# Patient Record
Sex: Male | Born: 1960 | Race: White | Hispanic: No | Marital: Married | Smoking: Never smoker
Health system: Southern US, Community
[De-identification: ages and names within clinical notes are randomized; demographics above are authoritative.]

## PROBLEM LIST (undated history)

## (undated) DIAGNOSIS — K219 Gastro-esophageal reflux disease without esophagitis: Secondary | ICD-10-CM

## (undated) DIAGNOSIS — I499 Cardiac arrhythmia, unspecified: Secondary | ICD-10-CM

## (undated) DIAGNOSIS — S42209A Unspecified fracture of upper end of unspecified humerus, initial encounter for closed fracture: Secondary | ICD-10-CM

## (undated) DIAGNOSIS — J45909 Unspecified asthma, uncomplicated: Secondary | ICD-10-CM

## (undated) DIAGNOSIS — E785 Hyperlipidemia, unspecified: Secondary | ICD-10-CM

## (undated) DIAGNOSIS — I493 Ventricular premature depolarization: Secondary | ICD-10-CM

## (undated) HISTORY — DX: Unspecified asthma, uncomplicated: J45.909

## (undated) HISTORY — PX: SQUAMOUS CELL CARCINOMA EXCISION: SHX2433

---

## 2007-12-02 ENCOUNTER — Emergency Department (HOSPITAL_BASED_OUTPATIENT_CLINIC_OR_DEPARTMENT_OTHER): Admission: EM | Admit: 2007-12-02 | Discharge: 2007-12-02 | Payer: Self-pay | Admitting: Emergency Medicine

## 2010-11-05 LAB — CBC
HCT: 43.8
Hemoglobin: 15
MCHC: 34.3
MCV: 92.4
Platelets: 302
RBC: 4.75
WBC: 8.8

## 2010-11-05 LAB — BASIC METABOLIC PANEL
CO2: 25
Calcium: 9.9
Creatinine, Ser: 0.9
GFR calc Af Amer: >60
Glucose, Bld: 106 — ABNORMAL HIGH
Sodium: 142

## 2010-11-05 LAB — DIFFERENTIAL
Eosinophils Absolute: 0.1
Lymphs Abs: 1.4
Monocytes Absolute: 0.4

## 2010-11-05 LAB — POCT CARDIAC MARKERS
CKMB, poc: <1 — ABNORMAL LOW
Troponin i, poc: <0.05

## 2015-05-11 ENCOUNTER — Emergency Department (HOSPITAL_COMMUNITY): Payer: PRIVATE HEALTH INSURANCE

## 2015-05-11 ENCOUNTER — Encounter (HOSPITAL_COMMUNITY): Payer: Self-pay | Admitting: Emergency Medicine

## 2015-05-11 ENCOUNTER — Emergency Department (HOSPITAL_COMMUNITY)
Admission: EM | Admit: 2015-05-11 | Discharge: 2015-05-11 | Disposition: A | Discharge status: Home / Self Care | Payer: PRIVATE HEALTH INSURANCE | Attending: Emergency Medicine | Admitting: Emergency Medicine

## 2015-05-11 DIAGNOSIS — Z8679 Personal history of other diseases of the circulatory system: Secondary | ICD-10-CM | POA: Diagnosis not present

## 2015-05-11 DIAGNOSIS — S42292A Other displaced fracture of upper end of left humerus, initial encounter for closed fracture: Secondary | ICD-10-CM | POA: Insufficient documentation

## 2015-05-11 DIAGNOSIS — Y9289 Other specified places as the place of occurrence of the external cause: Secondary | ICD-10-CM | POA: Diagnosis not present

## 2015-05-11 DIAGNOSIS — W1840XA Slipping, tripping and stumbling without falling, unspecified, initial encounter: Secondary | ICD-10-CM | POA: Diagnosis not present

## 2015-05-11 DIAGNOSIS — S4992XA Unspecified injury of left shoulder and upper arm, initial encounter: Secondary | ICD-10-CM | POA: Diagnosis present

## 2015-05-11 DIAGNOSIS — Y998 Other external cause status: Secondary | ICD-10-CM | POA: Insufficient documentation

## 2015-05-11 DIAGNOSIS — R0602 Shortness of breath: Secondary | ICD-10-CM | POA: Insufficient documentation

## 2015-05-11 DIAGNOSIS — S42201A Unspecified fracture of upper end of right humerus, initial encounter for closed fracture: Secondary | ICD-10-CM

## 2015-05-11 DIAGNOSIS — Y9389 Activity, other specified: Secondary | ICD-10-CM | POA: Insufficient documentation

## 2015-05-11 DIAGNOSIS — Z8719 Personal history of other diseases of the digestive system: Secondary | ICD-10-CM | POA: Insufficient documentation

## 2015-05-11 DIAGNOSIS — Z8639 Personal history of other endocrine, nutritional and metabolic disease: Secondary | ICD-10-CM | POA: Diagnosis not present

## 2015-05-11 HISTORY — DX: Gastro-esophageal reflux disease without esophagitis: K21.9

## 2015-05-11 HISTORY — DX: Hyperlipidemia, unspecified: E78.5

## 2015-05-11 HISTORY — DX: Ventricular premature depolarization: I49.3

## 2015-05-11 LAB — CBC WITH DIFFERENTIAL/PLATELET
Basophils Absolute: 0 10*3/uL (ref 0.0–0.1)
Basophils Relative: 0 %
Eosinophils Absolute: 0.1 10*3/uL (ref 0.0–0.7)
Eosinophils Relative: 1 %
HEMATOCRIT: 41.2 % (ref 39.0–52.0)
HEMOGLOBIN: 14 g/dL (ref 13.0–17.0)
LYMPHS ABS: 1.9 10*3/uL (ref 0.7–4.0)
LYMPHS PCT: 18 %
MCH: 32.2 pg (ref 26.0–34.0)
MCHC: 34 g/dL (ref 30.0–36.0)
MCV: 94.7 fL (ref 78.0–100.0)
MONOS PCT: 8 %
Monocytes Absolute: 0.9 10*3/uL (ref 0.1–1.0)
NEUTROS ABS: 7.7 10*3/uL (ref 1.7–7.7)
NEUTROS PCT: 73 %
Platelets: 221 10*3/uL (ref 150–400)
RBC: 4.35 MIL/uL (ref 4.22–5.81)
RDW: 12.8 % (ref 11.5–15.5)
WBC: 10.6 10*3/uL — ABNORMAL HIGH (ref 4.0–10.5)

## 2015-05-11 LAB — BASIC METABOLIC PANEL
ANION GAP: 7 (ref 5–15)
BUN: 14 mg/dL (ref 6–20)
CHLORIDE: 109 mmol/L (ref 101–111)
CO2: 24 mmol/L (ref 22–32)
Calcium: 9.2 mg/dL (ref 8.9–10.3)
Creatinine, Ser: 0.81 mg/dL (ref 0.61–1.24)
GFR calc non Af Amer: >60 mL/min (ref >60)
Glucose, Bld: 139 mg/dL — ABNORMAL HIGH (ref 65–99)
Potassium: 3.6 mmol/L (ref 3.5–5.1)
Sodium: 140 mmol/L (ref 135–145)

## 2015-05-11 LAB — PROTIME-INR
INR: 0.94 (ref 0.00–1.49)
Prothrombin Time: 12.4 seconds (ref 11.6–15.2)

## 2015-05-11 MED ORDER — HYDROMORPHONE HCL 1 MG/ML IJ SOLN
1.0000 mg | Freq: Once | INTRAMUSCULAR | Status: AC
  Administered 2015-05-11: 1 mg via INTRAVENOUS
  Filled 2015-05-11: qty 1

## 2015-05-11 MED ORDER — OXYCODONE-ACETAMINOPHEN 5-325 MG PO TABS: 1.0000 | ORAL_TABLET | Freq: Four times a day (QID) | ORAL | Status: DC | PRN

## 2015-05-11 MED ORDER — HYDROMORPHONE HCL 1 MG/ML IJ SOLN
1.0000 mg | Freq: Once | INTRAMUSCULAR | Status: AC
Start: 2015-05-11 — End: 2015-05-11
  Administered 2015-05-11: 1 mg via INTRAVENOUS
  Filled 2015-05-11: qty 1

## 2015-05-11 NOTE — ED Notes (Signed)
Patient transported to CT 

## 2015-05-11 NOTE — ED Notes (Signed)
Bed: [facility] Expected date:  Expected time:  Means of arrival:  Comments: pt

## 2015-05-11 NOTE — ED Notes (Signed)
Per EMS pt tripped on curb,put his arm out to catch himself, left shoulder injury, left shoulder deformed. 200 mg IV administered en route.

## 2015-05-11 NOTE — Discharge Instructions (Signed)
Percocet as prescribed as needed for pain.  Wear sling immobilizer as applied until followed up by orthopedics.  Follow-up with Dr. Eulah Ware from orthopedic surgery Monday morning at 8:30.  Ice for 20 minutes every 2 hours while awake for the next 3 days.   Humerus Fracture Treated With Immobilization The humerus is the large bone in your upper arm. You have a broken (fractured) humerus. These fractures are easily diagnosed with X-rays. TREATMENT  Simple fractures which will heal without disability are treated with simple immobilization. Immobilization means you will wear a cast, splint, or sling. You have a fracture which will do well with immobilization. The fracture will heal well simply by being held in a good position until it is stable enough to begin range of motion exercises. Do not take part in activities which would further injure your arm.  HOME CARE INSTRUCTIONS   Put ice on the injured area.  Put ice in a plastic bag.  Place a towel between your skin and the bag.  Leave the ice on for 15-20 minutes, 03-04 times a day.  If you have a cast:  Do not scratch the skin under the cast using sharp or pointed objects.  Check the skin around the cast every day. You may put lotion on any red or sore areas.  Keep your cast dry and clean.  If you have a splint:  Wear the splint as directed.  Keep your splint dry and clean.  You may loosen the elastic around the splint if your fingers become numb, tingle, or turn cold or blue.  If you have a sling:  Wear the sling as directed.  Do not put pressure on any part of your cast or splint until it is fully hardened.  Your cast or splint can be protected during bathing with a plastic bag. Do not lower the cast or splint into water.  Only take over-the-counter or prescription medicines for pain, discomfort, or fever as directed by your caregiver.  Do range of motion exercises as instructed by your caregiver.  Follow up as  directed by your caregiver. This is very important in order to avoid permanent injury or disability and chronic pain. SEEK IMMEDIATE MEDICAL CARE IF:   Your skin or nails in the injured arm turn blue or gray.  Your arm feels cold or numb.  You develop severe pain in the injured arm.  You are having problems with the medicines you were given. MAKE SURE YOU:   Understand these instructions.  Will watch your condition.  Will get help right away if you are not doing well or get worse.   This information is not intended to replace advice given to you by your health care provider. Make sure you discuss any questions you have with your health care provider.   Document Released: 04/28/2000 Document Revised: 02/10/2014 Document Reviewed: 06/14/2014 Elsevier Interactive Patient Education [employer]2016 [employer] Inc[employer]

## 2015-05-11 NOTE — ED Provider Notes (Signed)
CSN: [id number]     Arrival date & time 05/11/15  1456 History   First MD Initiated Contact with Patient 05/11/15 1525     Chief Complaint  Patient presents with  . Shoulder Injury     (Consider location/radiation/quality/duration/timing/severity/associated sxs/prior Treatment) HPI Comments: Patient is a 55 year old male with past medical history of mesenteric thrombosis, PVCs. He presents for evaluation of a left shoulder injury. He states that he was walking into the movie theater when he tripped over the curb and caught himself with his arm. He then struck his shoulder on a small retaining wall. He is complaining of severe pain in his left shoulder. He was given pain medication in route which has not helped much. He denies other injury. He denies any numbness, tingling, or weakness in his hand or arm.  Patient is a 55 y.o. male presenting with shoulder injury. The history is provided by the patient.  Shoulder Injury This is a new problem. The current episode started less than 1 hour ago. The problem occurs constantly. The problem has not changed since onset.Associated symptoms include shortness of breath. Pertinent negatives include no chest pain. Nothing aggravates the symptoms. Nothing relieves the symptoms. He has tried nothing for the symptoms. The treatment provided no relief.    Past Medical History  Diagnosis Date  . PVC (premature ventricular contraction)   . GERD (gastroesophageal reflux disease)   . Hyperlipidemia    History reviewed. No pertinent past surgical history. History reviewed. No pertinent family history. Social History  Substance Use Topics  . Smoking status: Never Smoker   . Smokeless tobacco: None  . Alcohol Use: Yes    Review of Systems  Respiratory: Positive for shortness of breath.   Cardiovascular: Negative for chest pain.  All other systems reviewed and are negative.     Allergies  Review of patient's allergies indicates no known  allergies.  Home Medications   Prior to Admission medications   Not on File   BP 155/99 mmHg  Pulse 60  Temp(Src) 98.3 F (36.8 C) (Oral)  Resp 16  SpO2 99% Physical Exam  Constitutional: He is oriented to person, place, and time. He appears well-developed and well-nourished. No distress.  HENT:  Head: Normocephalic and atraumatic.  Neck: Normal range of motion. Neck supple.  Cardiovascular: Normal rate, regular rhythm and normal heart sounds.   No murmur heard. Pulmonary/Chest: Effort normal and breath sounds normal. No respiratory distress. He has no wheezes.  Abdominal: Soft. Bowel sounds are normal.  Musculoskeletal: Normal range of motion.  The left shoulder reveals an obvious deformity. There is tenderness to palpation and severe pain with any movement. Ulnar and radial pulses are easily palpable. He is able to flex, extend, and oppose all fingers and sensation is intact throughout the entire hand.  Neurological: He is alert and oriented to person, place, and time.  Skin: Skin is warm and dry. He is not diaphoretic.  Nursing note and vitals reviewed.   ED Course  Procedures (including critical care time) Labs Review Labs Reviewed  BASIC METABOLIC PANEL  CBC WITH DIFFERENTIAL/PLATELET  PROTIME-INR    Imaging Review Dg Shoulder Left  05/11/2015  CLINICAL DATA:  55 year old male tripped on curb and fell. Pain. Initial encounter. EXAM: LEFT SHOULDER - 2+ VIEW COMPARISON:  None. FINDINGS: Comminuted and impacted proximal right humerus fracture. The greater tuberosity constitutes a large butterfly fragment. Single scapular Y-view with suboptimal technique provided. I suspect anterior glenohumeral dislocation is associated. No scapula or clavicle  fracture identified. Visible left ribs and lung parenchyma within normal limits. IMPRESSION: Severely comminuted proximal left humerus fracture with probable superimposed anterior glenohumeral dislocation. Electronically Signed   By: Odessa Fleming M.D.   On: 05/11/2015 15:29   I have personally reviewed and evaluated these images and lab results as part of my medical decision-making.    MDM   Final diagnoses:  None    X-rays reveal a comminuted proximal humerus fracture. There was the initial suggestion of a dislocation as well on the x-rays. In consultation with Dr. Turner Daniels from orthopedics, a CT scan was obtained which revealed no dislocation. The family then informed me that their son works for [employer] and would prefer to have his surgery done by one of them. I spoke with Dr. Eulah Pont who is recommending a sling and swath, ice, and follow-up on Monday to discuss his options.    Geoffery Lyons, MD 05/11/15 [phone]

## 2015-05-14 ENCOUNTER — Encounter (HOSPITAL_BASED_OUTPATIENT_CLINIC_OR_DEPARTMENT_OTHER): Payer: Self-pay | Admitting: *Deleted

## 2015-05-14 NOTE — H&P (Signed)
      ORTHOPAEDIC CONSULTATION  REQUESTING PHYSICIAN: Renette Butters, MD  Chief Complaint: Left proximal humerus fx  HPI: Andrew Ware is a 55 y.o. male who complains of a mechanical fall  Past Medical History  Diagnosis Date  . PVC (premature ventricular contraction)   . GERD (gastroesophageal reflux disease)   . Hyperlipidemia    No past surgical history on file. Social History   Social History  . Marital Status: Married    Spouse Name: N/A  . Number of Children: N/A  . Years of Education: N/A   Social History Main Topics  . Smoking status: Never Smoker   . Smokeless tobacco: Not on file  . Alcohol Use: Yes  . Drug Use: Not on file  . Sexual Activity: Not on file   Other Topics Concern  . Not on file   Social History Narrative  . No narrative on file   No family history on file. No Known Allergies Prior to Admission medications   Medication Sig Start Date End Date Taking? Authorizing Provider  atorvastatin (LIPITOR) 10 MG tablet Take 10 mg by mouth daily.    Historical Provider, MD  bisoprolol (ZEBETA) 10 MG tablet Take 10 mg by mouth daily.    Historical Provider, MD  Multiple Vitamins-Minerals (MULTIVITAMIN & MINERAL PO) Take 1 tablet by mouth daily.    Historical Provider, MD  Omega-3 Fatty Acids (FISH OIL) 1000 MG CAPS Take 1 capsule by mouth daily.    Historical Provider, MD  omeprazole (PRILOSEC) 40 MG capsule Take 40 mg by mouth daily.    Historical Provider, MD  oxyCODONE-acetaminophen (PERCOCET) 5-325 MG tablet Take 1-2 tablets by mouth every 6 (six) hours as needed. 05/11/15   Veryl Speak, MD  perindopril (ACEON) 2 MG tablet Take 2 mg by mouth daily.    Historical Provider, MD   No results found.  Positive ROS: All other systems have been reviewed and were otherwise negative with the exception of those mentioned in the HPI and as above.  Labs cbc  Recent Labs  05/11/15 1600  WBC 10.6*  HGB 14.0  HCT 41.2  PLT 221    Labs inflam No  results for input(s): CRP in the last 72 hours.  Invalid input(s): ESR  Labs coag  Recent Labs  05/11/15 1600  INR 0.94     Recent Labs  05/11/15 1600  NA 140  K 3.6  CL 109  CO2 24  GLUCOSE 139*  BUN 14  CREATININE 0.81  CALCIUM 9.2    Physical Exam: There were no vitals filed for this visit. General: Alert, no acute distress Cardiovascular: No pedal edema Respiratory: No cyanosis, no use of accessory musculature GI: No organomegaly, abdomen is soft and non-tender Skin: No lesions in the area of chief complaint other than those listed below in MSK exam.  Neurologic: Sensation intact distally save for the below mentioned MSK exam Psychiatric: Patient is competent for consent with normal mood and affect Lymphatic: No axillary or cervical lymphadenopathy  MUSCULOSKELETAL:  LUE: NVI, pain with any ROM Other extremities are atraumatic with painless ROM and NVI.  Assessment: L proximal humerus fx  Plan: ORIF    Renette Butters, MD Cell [phone]   05/14/2015 3:04 PM

## 2015-05-17 ENCOUNTER — Ambulatory Visit (HOSPITAL_BASED_OUTPATIENT_CLINIC_OR_DEPARTMENT_OTHER): Payer: PRIVATE HEALTH INSURANCE | Admitting: Certified Registered"

## 2015-05-17 ENCOUNTER — Ambulatory Visit (HOSPITAL_BASED_OUTPATIENT_CLINIC_OR_DEPARTMENT_OTHER)
Admission: RE | Admit: 2015-05-17 | Discharge: 2015-05-17 | Disposition: A | Discharge status: Home / Self Care | Payer: PRIVATE HEALTH INSURANCE | Source: Ambulatory Visit | Attending: Orthopedic Surgery | Admitting: Orthopedic Surgery

## 2015-05-17 ENCOUNTER — Encounter (HOSPITAL_BASED_OUTPATIENT_CLINIC_OR_DEPARTMENT_OTHER)
Admission: RE | Disposition: A | Discharge status: Home / Self Care | Payer: Self-pay | Source: Ambulatory Visit | Attending: Orthopedic Surgery

## 2015-05-17 ENCOUNTER — Encounter (HOSPITAL_BASED_OUTPATIENT_CLINIC_OR_DEPARTMENT_OTHER): Payer: Self-pay | Admitting: *Deleted

## 2015-05-17 ENCOUNTER — Ambulatory Visit (HOSPITAL_COMMUNITY): Payer: PRIVATE HEALTH INSURANCE

## 2015-05-17 DIAGNOSIS — W19XXXA Unspecified fall, initial encounter: Secondary | ICD-10-CM | POA: Insufficient documentation

## 2015-05-17 DIAGNOSIS — E785 Hyperlipidemia, unspecified: Secondary | ICD-10-CM | POA: Diagnosis not present

## 2015-05-17 DIAGNOSIS — S42202A Unspecified fracture of upper end of left humerus, initial encounter for closed fracture: Secondary | ICD-10-CM | POA: Insufficient documentation

## 2015-05-17 DIAGNOSIS — K219 Gastro-esophageal reflux disease without esophagitis: Secondary | ICD-10-CM | POA: Diagnosis not present

## 2015-05-17 DIAGNOSIS — Z79899 Other long term (current) drug therapy: Secondary | ICD-10-CM | POA: Insufficient documentation

## 2015-05-17 DIAGNOSIS — S42209A Unspecified fracture of upper end of unspecified humerus, initial encounter for closed fracture: Secondary | ICD-10-CM

## 2015-05-17 HISTORY — PX: ORIF HUMERUS FRACTURE: SHX2126

## 2015-05-17 HISTORY — DX: Unspecified fracture of upper end of unspecified humerus, initial encounter for closed fracture: S42.209A

## 2015-05-17 HISTORY — DX: Cardiac arrhythmia, unspecified: I49.9

## 2015-05-17 SURGERY — OPEN REDUCTION INTERNAL FIXATION (ORIF) PROXIMAL HUMERUS FRACTURE
Anesthesia: General | Site: Arm Upper | Laterality: Left

## 2015-05-17 MED ORDER — 0.9 % SODIUM CHLORIDE (POUR BTL) OPTIME
TOPICAL | Status: DC | PRN
  Administered 2015-05-17: 200 mL

## 2015-05-17 MED ORDER — PROMETHAZINE HCL 25 MG/ML IJ SOLN: 6.2500 mg | INTRAMUSCULAR | Status: DC | PRN

## 2015-05-17 MED ORDER — LACTATED RINGERS IV SOLN
INTRAVENOUS | Status: DC
  Administered 2015-05-17 (×2): via INTRAVENOUS

## 2015-05-17 MED ORDER — LIDOCAINE HCL (CARDIAC) 20 MG/ML IV SOLN
INTRAVENOUS | Status: AC
  Filled 2015-05-17: qty 5

## 2015-05-17 MED ORDER — OXYCODONE HCL 5 MG PO TABS: 5.0000 mg | ORAL_TABLET | Freq: Once | ORAL | Status: DC

## 2015-05-17 MED ORDER — ACETAMINOPHEN 500 MG PO TABS
1000.0000 mg | ORAL_TABLET | Freq: Once | ORAL | Status: AC
  Administered 2015-05-17: 1000 mg via ORAL

## 2015-05-17 MED ORDER — DEXAMETHASONE SODIUM PHOSPHATE 4 MG/ML IJ SOLN
INTRAMUSCULAR | Status: DC | PRN
  Administered 2015-05-17: 10 mg via INTRAVENOUS

## 2015-05-17 MED ORDER — GLYCOPYRROLATE 0.2 MG/ML IJ SOLN: 0.2000 mg | Freq: Once | INTRAMUSCULAR | Status: DC | PRN

## 2015-05-17 MED ORDER — CEFAZOLIN SODIUM-DEXTROSE 2-4 GM/100ML-% IV SOLN
2.0000 g | INTRAVENOUS | Status: AC
  Administered 2015-05-17: 2 g via INTRAVENOUS

## 2015-05-17 MED ORDER — FENTANYL CITRATE (PF) 100 MCG/2ML IJ SOLN
50.0000 ug | INTRAMUSCULAR | Status: AC | PRN
  Administered 2015-05-17: 50 ug via INTRAVENOUS
  Administered 2015-05-17: 100 ug via INTRAVENOUS
  Administered 2015-05-17: 50 ug via INTRAVENOUS

## 2015-05-17 MED ORDER — MIDAZOLAM HCL 2 MG/2ML IJ SOLN
INTRAMUSCULAR | Status: AC
  Filled 2015-05-17: qty 2

## 2015-05-17 MED ORDER — ACETAMINOPHEN 500 MG PO TABS
ORAL_TABLET | ORAL | Status: AC
  Filled 2015-05-17: qty 2

## 2015-05-17 MED ORDER — BUPIVACAINE-EPINEPHRINE (PF) 0.5% -1:200000 IJ SOLN
INTRAMUSCULAR | Status: DC | PRN
  Administered 2015-05-17: 20 mL via PERINEURAL

## 2015-05-17 MED ORDER — DEXAMETHASONE SODIUM PHOSPHATE 10 MG/ML IJ SOLN
INTRAMUSCULAR | Status: AC
  Filled 2015-05-17: qty 1

## 2015-05-17 MED ORDER — MIDAZOLAM HCL 2 MG/2ML IJ SOLN
1.0000 mg | INTRAMUSCULAR | Status: DC | PRN
  Administered 2015-05-17: 1 mg via INTRAVENOUS
  Administered 2015-05-17: 2 mg via INTRAVENOUS

## 2015-05-17 MED ORDER — FENTANYL CITRATE (PF) 100 MCG/2ML IJ SOLN
INTRAMUSCULAR | Status: AC
  Filled 2015-05-17: qty 2

## 2015-05-17 MED ORDER — ONDANSETRON HCL 4 MG PO TABS: 4.0000 mg | ORAL_TABLET | Freq: Three times a day (TID) | ORAL | Status: DC | PRN

## 2015-05-17 MED ORDER — OXYCODONE-ACETAMINOPHEN 5-325 MG PO TABS: 2.0000 | ORAL_TABLET | ORAL | Status: DC | PRN

## 2015-05-17 MED ORDER — ONDANSETRON HCL 4 MG/2ML IJ SOLN
INTRAMUSCULAR | Status: AC
  Filled 2015-05-17: qty 2

## 2015-05-17 MED ORDER — ONDANSETRON HCL 4 MG/2ML IJ SOLN
INTRAMUSCULAR | Status: DC | PRN
  Administered 2015-05-17: 4 mg via INTRAVENOUS

## 2015-05-17 MED ORDER — LIDOCAINE HCL (CARDIAC) 20 MG/ML IV SOLN
INTRAVENOUS | Status: DC | PRN
  Administered 2015-05-17: 30 mg via INTRAVENOUS

## 2015-05-17 MED ORDER — PROPOFOL 10 MG/ML IV BOLUS
INTRAVENOUS | Status: DC | PRN
  Administered 2015-05-17: 150 mg via INTRAVENOUS

## 2015-05-17 MED ORDER — DEXTROSE-NACL 5-0.45 % IV SOLN: 100.0000 mL/h | INTRAVENOUS | Status: DC

## 2015-05-17 MED ORDER — CHLORHEXIDINE GLUCONATE 4 % EX LIQD: 60.0000 mL | Freq: Once | CUTANEOUS | Status: DC

## 2015-05-17 MED ORDER — SCOPOLAMINE 1 MG/3DAYS TD PT72: 1.0000 | MEDICATED_PATCH | Freq: Once | TRANSDERMAL | Status: DC | PRN

## 2015-05-17 MED ORDER — FENTANYL CITRATE (PF) 100 MCG/2ML IJ SOLN: 25.0000 ug | INTRAMUSCULAR | Status: DC | PRN

## 2015-05-17 MED ORDER — SUCCINYLCHOLINE CHLORIDE 20 MG/ML IJ SOLN
INTRAMUSCULAR | Status: DC | PRN
  Administered 2015-05-17: 100 mg via INTRAVENOUS

## 2015-05-17 MED ORDER — CEFAZOLIN SODIUM-DEXTROSE 2-4 GM/100ML-% IV SOLN
INTRAVENOUS | Status: AC
  Filled 2015-05-17: qty 100

## 2015-05-17 SURGICAL SUPPLY — 79 items
BIT DRILL 3.2 (BIT) ×2
BIT DRILL 3.2XCALB NS DISP (BIT) ×1 IMPLANT
BIT DRILL CALIBRATED 2.7 (BIT) ×2 IMPLANT
BIT DRILL CALIBRATED 2.7MM (BIT) ×1
BIT DRL 3.2XCALB NS DISP (BIT) ×1
BLADE CLIPPER SURG (BLADE) IMPLANT
BLADE SURG 15 STRL LF DISP TIS (BLADE) ×2 IMPLANT
BLADE SURG 15 STRL SS (BLADE) ×4
CHLORAPREP W/TINT 26ML (MISCELLANEOUS) ×3 IMPLANT
CLOSURE STERI-STRIP 1/2X4 (GAUZE/BANDAGES/DRESSINGS) ×1
CLSR STERI-STRIP ANTIMIC 1/2X4 (GAUZE/BANDAGES/DRESSINGS) ×2 IMPLANT
COVER BACK TABLE 60X90IN (DRAPES) IMPLANT
DECANTER SPIKE VIAL GLASS SM (MISCELLANEOUS) IMPLANT
DRAPE C-ARM 42X72 X-RAY (DRAPES) ×6 IMPLANT
DRAPE EXTREMITY T 121X128X90 (DRAPE) IMPLANT
DRAPE IMP U-DRAPE 54X76 (DRAPES) ×6 IMPLANT
DRAPE INCISE IOBAN 66X45 STRL (DRAPES) ×3 IMPLANT
DRAPE OEC MINIVIEW 54X84 (DRAPES) ×3 IMPLANT
DRAPE U-SHAPE 47X51 STRL (DRAPES) ×3 IMPLANT
DRAPE U-SHAPE 76X120 STRL (DRAPES) ×6 IMPLANT
DRSG MEPILEX BORDER 4X8 (GAUZE/BANDAGES/DRESSINGS) ×3 IMPLANT
ELECT REM PT RETURN 9FT ADLT (ELECTROSURGICAL) ×3
ELECTRODE REM PT RTRN 9FT ADLT (ELECTROSURGICAL) ×1 IMPLANT
GAUZE SPONGE 4X4 12PLY STRL (GAUZE/BANDAGES/DRESSINGS) ×3 IMPLANT
GAUZE XEROFORM 1X8 LF (GAUZE/BANDAGES/DRESSINGS) ×3 IMPLANT
GLOVE BIO SURGEON STRL SZ7 (GLOVE) ×3 IMPLANT
GLOVE BIO SURGEON STRL SZ7.5 (GLOVE) ×6 IMPLANT
GLOVE BIOGEL PI IND STRL 7.0 (GLOVE) ×4 IMPLANT
GLOVE BIOGEL PI IND STRL 8 (GLOVE) ×2 IMPLANT
GLOVE BIOGEL PI INDICATOR 7.0 (GLOVE) ×8
GLOVE BIOGEL PI INDICATOR 8 (GLOVE) ×4
GLOVE ECLIPSE 6.5 STRL STRAW (GLOVE) ×6 IMPLANT
GLOVE SURG ORTHO 8.0 STRL STRW (GLOVE) ×3 IMPLANT
GOWN STRL REUS W/ TWL LRG LVL3 (GOWN DISPOSABLE) ×2 IMPLANT
GOWN STRL REUS W/ TWL XL LVL3 (GOWN DISPOSABLE) ×1 IMPLANT
GOWN STRL REUS W/TWL LRG LVL3 (GOWN DISPOSABLE) ×4
GOWN STRL REUS W/TWL XL LVL3 (GOWN DISPOSABLE) ×2
K-WIRE 2X5 SS THRDED S3 (WIRE) ×9
KWIRE 2X5 SS THRDED S3 (WIRE) ×3 IMPLANT
NS IRRIG 1000ML POUR BTL (IV SOLUTION) ×3 IMPLANT
PACK ARTHROSCOPY DSU (CUSTOM PROCEDURE TRAY) ×3 IMPLANT
PACK BASIN DAY SURGERY FS (CUSTOM PROCEDURE TRAY) ×3 IMPLANT
PEG LOCKING 3.2MMX44 (Peg) ×3 IMPLANT
PEG LOCKING 3.2MMX54 (Peg) ×6 IMPLANT
PEG LOCKING 3.2X34 (Screw) ×3 IMPLANT
PEG LOCKING 3.2X36 (Screw) ×3 IMPLANT
PEG LOCKING 3.2X40 (Peg) ×6 IMPLANT
PENCIL BUTTON HOLSTER BLD 10FT (ELECTRODE) ×3 IMPLANT
PENCIL FOOT CONTROL (ELECTRODE) IMPLANT
PLATE PROX HUMERUS 4H LEFT LOW (Plate) ×3 IMPLANT
SCREW CORTICAL LOW PROF 3.5X32 (Screw) ×3 IMPLANT
SCREW LOW PROFILE 3.5X30MM TIS (Screw) ×3 IMPLANT
SLEEVE MEASURING 3.2 (BIT) ×3 IMPLANT
SLEEVE SCD COMPRESS KNEE MED (MISCELLANEOUS) ×3 IMPLANT
SLING ARM FOAM STRAP LRG (SOFTGOODS) ×3 IMPLANT
SLING ARM IMMOBILIZER LRG (SOFTGOODS) IMPLANT
SLING ARM IMMOBILIZER MED (SOFTGOODS) IMPLANT
SLING ARM MED ADULT FOAM STRAP (SOFTGOODS) IMPLANT
SLING ARM XL FOAM STRAP (SOFTGOODS) IMPLANT
SPONGE LAP 18X18 X RAY DECT (DISPOSABLE) ×6 IMPLANT
STAPLER VISISTAT 35W (STAPLE) IMPLANT
SUCTION FRAZIER HANDLE 10FR (MISCELLANEOUS) ×2
SUCTION TUBE FRAZIER 10FR DISP (MISCELLANEOUS) ×1 IMPLANT
SUPPORT WRAP ARM LG (MISCELLANEOUS) ×3 IMPLANT
SUT ETHILON 3 0 PS 1 (SUTURE) IMPLANT
SUT FIBERWIRE #2 38 T-5 BLUE (SUTURE) ×9
SUT RETRIEVER MED (INSTRUMENTS) IMPLANT
SUT VIC AB 0 CT1 27 (SUTURE) ×2
SUT VIC AB 0 CT1 27XBRD ANBCTR (SUTURE) ×1 IMPLANT
SUT VIC AB 2-0 SH 27 (SUTURE)
SUT VIC AB 2-0 SH 27XBRD (SUTURE) IMPLANT
SUT VIC AB 3-0 SH 27 (SUTURE)
SUT VIC AB 3-0 SH 27X BRD (SUTURE) IMPLANT
SUT VICRYL 3-0 CR8 SH (SUTURE) ×3 IMPLANT
SUTURE FIBERWR #2 38 T-5 BLUE (SUTURE) ×3 IMPLANT
SYR BULB 3OZ (MISCELLANEOUS) ×3 IMPLANT
TOWEL OR 17X24 6PK STRL BLUE (TOWEL DISPOSABLE) ×3 IMPLANT
TOWEL OR NON WOVEN STRL DISP B (DISPOSABLE) ×3 IMPLANT
YANKAUER SUCT BULB TIP NO VENT (SUCTIONS) ×3 IMPLANT

## 2015-05-17 NOTE — Interval H&P Note (Signed)
History and Physical Interval Note:  05/17/2015 1:20 PM  Andrew Ware  has presented today for surgery, with the diagnosis of LEFT PROXIMAL HUMERUS FRACTURE   The various methods of treatment have been discussed with the patient and family. After consideration of risks, benefits and other options for treatment, the patient has consented to  Procedure(s): OPEN REDUCTION INTERNAL FIXATION (ORIF) LEFT PROXIMAL HUMERUS FRACTURE (Left) as a surgical intervention .  The patient's history has been reviewed, patient examined, no change in status, stable for surgery.  I have reviewed the patient's chart and labs.  Questions were answered to the patient's satisfaction.     Partick Musselman D

## 2015-05-17 NOTE — Discharge Instructions (Signed)
Keep dressings on and dry until follow up  Keep arm in sling Post Anesthesia Home Care Instructions  Activity: Get plenty of rest for the remainder of the day. A responsible adult should stay with you for 24 hours following the procedure.  For the next 24 hours, DO NOT: -Drive a car -Advertising copywriterperate machinery -Drink alcoholic beverages -Take any medication unless instructed by your physician -Make any legal decisions or sign important papers.  Meals: Start with liquid foods such as gelatin or soup. Progress to regular foods as tolerated. Avoid greasy, spicy, heavy foods. If nausea and/or vomiting occur, drink only clear liquids until the nausea and/or vomiting subsides. Call your physician if vomiting continues.  Special Instructions/Symptoms: Your throat may feel dry or sore from the anesthesia or the breathing tube placed in your throat during surgery. If this causes discomfort, gargle with warm salt water. The discomfort should disappear within 24 hours.  If you had a scopolamine patch placed behind your ear for the management of post- operative nausea and/or vomiting:  1. The medication in the patch is effective for 72 hours, after which it should be removed.  Wrap patch in a tissue and discard in the trash. Wash hands thoroughly with soap and water. 2. You may remove the patch earlier than 72 hours if you experience unpleasant side effects which may include dry mouth, dizziness or visual disturbances. 3. Avoid touching the patch. Wash your hands with soap and water after contact with the patch.     Regional Anesthesia Blocks  1. Numbness or the inability to move the "blocked" extremity may last from 3-48 hours after placement. The length of time depends on the medication injected and your individual response to the medication. If the numbness is not going away after 48 hours, call your surgeon.  2. The extremity that is blocked will need to be protected until the numbness is gone and the   Strength has returned. Because you cannot feel it, you will need to take extra care to avoid injury. Because it may be weak, you may have difficulty moving it or using it. You may not know what position it is in without looking at it while the block is in effect.  3. For blocks in the legs and feet, returning to weight bearing and walking needs to be done carefully. You will need to wait until the numbness is entirely gone and the strength has returned. You should be able to move your leg and foot normally before you try and bear weight or walk. You will need someone to be with you when you first try to ensure you do not fall and possibly risk injury.  4. Bruising and tenderness at the needle site are common side effects and will resolve in a few days.  5. Persistent numbness or new problems with movement should be communicated to the surgeon or the [facility] [phone])/ [facility] [phone]).

## 2015-05-17 NOTE — Transfer of Care (Signed)
Immediate Anesthesia Transfer of Care Note  Patient: Andrew NewnessDoug Ware  Procedure(s) Performed: Procedure(s): OPEN REDUCTION INTERNAL FIXATION (ORIF) LEFT PROXIMAL HUMERUS FRACTURE (Left)  Patient Location: PACU  Anesthesia Type:General  Level of Consciousness: awake and sedated  Airway & Oxygen Therapy: Patient Spontanous Breathing and Patient connected to face mask oxygen  Post-op Assessment: Report given to RN and Post -op Vital signs reviewed and stable  Post vital signs: Reviewed and stable  Last Vitals:  Filed Vitals:   05/17/15 1235 05/17/15 1240  BP:    Pulse: 107 102  Temp:    Resp: 15 18    Complications: No apparent anesthesia complications

## 2015-05-17 NOTE — Progress Notes (Signed)
Assisted Dr. Turk with left, ultrasound guided, interscalene  block. Side rails up, monitors on throughout procedure. See vital signs in flow sheet. Tolerated Procedure well. 

## 2015-05-17 NOTE — Anesthesia Preprocedure Evaluation (Signed)
Anesthesia Evaluation  Patient identified by MRN, date of birth, ID band Patient awake    Reviewed: Allergy & Precautions, NPO status , Patient's Chart, lab work & pertinent test results, reviewed documented beta blocker date and time   Airway Mallampati: II  TM Distance: >3 FB Neck ROM: Full    Dental  (+) Teeth Intact, Dental Advisory Given   Pulmonary neg pulmonary ROS,    Pulmonary exam normal breath sounds clear to auscultation       Cardiovascular Normal cardiovascular exam+ dysrhythmias (PVCs)  Rhythm:Regular Rate:Normal     Neuro/Psych negative neurological ROS  negative psych ROS   GI/Hepatic Neg liver ROS, GERD  Medicated,  Endo/Other  negative endocrine ROS  Renal/GU negative Renal ROS     Musculoskeletal negative musculoskeletal ROS (+) Left proximal humerus fracture    Abdominal   Peds  Hematology negative hematology ROS (+)   Anesthesia Other Findings Day of surgery medications reviewed with the patient.  Reproductive/Obstetrics                             Anesthesia Physical Anesthesia Plan  ASA: II  Anesthesia Plan: General   Post-op Pain Management:  Regional for Post-op pain   Induction: Intravenous  Airway Management Planned: Oral ETT  Additional Equipment:   Intra-op Plan:   Post-operative Plan: Extubation in OR  Informed Consent: I have reviewed the patients History and Physical, chart, labs and discussed the procedure including the risks, benefits and alternatives for the proposed anesthesia with the patient or authorized representative who has indicated his/her understanding and acceptance.   Dental advisory given  Plan Discussed with: CRNA  Anesthesia Plan Comments: (Risks/benefits of general anesthesia discussed with patient including risk of damage to teeth, lips, gum, and tongue, nausea/vomiting, allergic reactions to medications, and the  possibility of heart attack, stroke and death.  All patient questions answered.  Patient wishes to proceed.  GETA plus ISB)        Anesthesia Quick Evaluation

## 2015-05-17 NOTE — Op Note (Signed)
05/17/2015  4:04 PM  PATIENT:  Andrew Ware    PRE-OPERATIVE DIAGNOSIS:  LEFT PROXIMAL HUMERUS FRACTURE   POST-OPERATIVE DIAGNOSIS:  Same  PROCEDURE:  OPEN REDUCTION INTERNAL FIXATION (ORIF) LEFT PROXIMAL HUMERUS FRACTURE  SURGEON:  Marce Schartz, Jewel BaizeIMOTHY D, MD  PHYSICIAN ASSISTANT: Janace LittenBrandon Parry, OPA-C, present and scrubbed throughout the case, critical for completion in a timely fashion, and for retraction, instrumentation, and closure.   ANESTHESIA:   General  PREOPERATIVE INDICATIONS:  Andrew Ware is a  55 y.o. male with a diagnosis of LEFT PROXIMAL HUMERUS FRACTURE  who elected for surgical management.    The risks benefits and alternatives were discussed with the patient including but not limited to the risks of nonoperative treatment, versus surgical intervention including infection, bleeding, nerve injury, malunion, nonunion, the need for revision surgery, hardware prominence, hardware failure, the need for hardware removal, blood clots, cardiopulmonary complications, conversion to arthroplasty, morbidity, mortality, among others, and they were willing to proceed.  Predicted outcome is good, although there will be at least a six to nine month expected recovery.    OPERATIVE IMPLANTS: Biomet alps locking plate  OPERATIVE FINDINGS: Displaced proximal humerus fracture  OPERATIVE PROCEDURE: The patient was brought to the operating room and placed in the supine position. General anesthesia was administered. IV antibiotics were given. He was placed in the beach chair position. All bony prominences were padded. The upper extremity was prepped and draped in usual sterile fashion. Deltopectoral incision was performed.  I exposed the fracture site, and placed deep retractors. I did not tenotomize the biceps tendon. This was left in place. I elevated a small portion of the deltoid off of the shaft, in order to gain access for the plate. I placed supraspinatus and subscapularis stitches, and then  reduced the head onto the shaft. This was maintained in satisfactory position.  I applied the plate and secured it into the sliding hole first. I confirmed position of the reduction and the plate with C-arm, and I placed a total of 2 guidewires into the appropriate position in the head. I was satisfied that the plate was distal appropriately, and then secured the plate proximally with smooth pegs, taking care to prevent penetration into the arch articular surface, using C-arm, as well as manual feel using a hand drill.  I then secured the plate distally using another cortical screw. Once complete fixation and reduction of been achieved, took final C-arm pictures, and irrigated the wounds copiously, and repaired the deltopectoral interval with Vicryl followed by Vicryl for the subcutaneous tissue with Monocryl and Steri-Strips for the skin. He was placed in a sling. He had a preoperative regional block as well. He tolerated the procedure well with no complications.   POST OPERATIVE PLAN: Sling full time, DVT px: Ambulation and foot pumps

## 2015-05-17 NOTE — Anesthesia Procedure Notes (Addendum)
Anesthesia Regional Block:  Interscalene brachial plexus block  Pre-Anesthetic Checklist: ,, timeout performed, Correct Patient, Correct Site, Correct Laterality, Correct Procedure, Correct Position, site marked, Risks and benefits discussed,  Surgical consent,  Pre-op evaluation,  At surgeon's request and post-op pain management  Laterality: Left  Prep: chloraprep       Needles:  Injection technique: Single-shot  Needle Type: Echogenic Stimulator Needle     Needle Length: 5cm 5 cm Needle Gauge: 22 and 22 G    Additional Needles:  Procedures: ultrasound guided (picture in chart) Interscalene brachial plexus block Narrative:  Injection made incrementally with aspirations every 5 mL.  Performed by: Personally  Anesthesiologist: Cecile HearingURK, STEPHEN EDWARD  Additional Notes: Functioning IV was confirmed and monitors were applied.  A 50mm 22ga Arrow echogenic stimulator needle was used. Sterile prep and drape, hand hygiene and sterile gloves were used.  Negative aspiration and negative test dose prior to incremental administration of local anesthetic. The patient tolerated the procedure well.  Ultrasound guidance: relevent anatomy identified, needle position confirmed, local anesthetic spread visualized around nerve(s), vascular puncture avoided.  Image printed for medical record.    Procedure Name: Intubation Date/Time: 05/17/2015 1:32 PM Performed by: Carols Clemence D Pre-anesthesia Checklist: Patient identified, Emergency Drugs available, Suction available and Patient being monitored Patient Re-evaluated:Patient Re-evaluated prior to inductionOxygen Delivery Method: Circle System Utilized Preoxygenation: Pre-oxygenation with 100% oxygen Intubation Type: IV induction Ventilation: Mask ventilation without difficulty Laryngoscope Size: Mac and 3 Grade View: Grade I Tube type: Oral Tube size: 7.0 mm Number of attempts: 1 Airway Equipment and Method: Stylet and Oral  airway Placement Confirmation: ETT inserted through vocal cords under direct vision,  positive ETCO2 and breath sounds checked- equal and bilateral Secured at: 21 cm Tube secured with: Tape Dental Injury: Teeth and Oropharynx as per pre-operative assessment

## 2015-05-18 NOTE — Anesthesia Postprocedure Evaluation (Signed)
Anesthesia Post Note  Patient: Andrew NewnessDoug Bozarth  Procedure(s) Performed: Procedure(s) (LRB): OPEN REDUCTION INTERNAL FIXATION (ORIF) LEFT PROXIMAL HUMERUS FRACTURE (Left)  Patient location during evaluation: PACU Anesthesia Type: General and Regional Level of consciousness: awake and alert Pain management: pain level controlled Vital Signs Assessment: post-procedure vital signs reviewed and stable Respiratory status: spontaneous breathing, nonlabored ventilation, respiratory function stable and patient connected to nasal cannula oxygen Cardiovascular status: blood pressure returned to baseline and stable Postop Assessment: no signs of nausea or vomiting Anesthetic complications: no    Last Vitals:  Filed Vitals:   05/17/15 1642 05/17/15 1712  BP:    Pulse: 108 80  Temp:  37 C  Resp: 17 18    Last Pain:  Filed Vitals:   05/17/15 1713  PainSc: 0-No pain                 Cecile HearingStephen Edward Turk

## 2015-05-21 ENCOUNTER — Encounter (HOSPITAL_BASED_OUTPATIENT_CLINIC_OR_DEPARTMENT_OTHER): Payer: Self-pay | Admitting: Orthopedic Surgery

## 2016-07-08 IMAGING — CT CT SHOULDER*L* W/O CM
1 series · 12 of 14 positions shown, 15 images · non-contrast
Comparison: 05/11/2015

CLINICAL DATA: Fall on outstretched arm, left shoulder injury.
Fracture shown on conventional radiographs involving the proximal
humerus.

EXAM:
CT OF THE LEFT SHOULDER WITHOUT CONTRAST
TECHNIQUE: Multidetector CT imaging was performed according to the standard
protocol. Multiplanar CT image reconstructions were also generated.

[Series 6: shoulder st · axial · 0.54mm/px · z∈[+1352,+1472]mm · 12 of 48 slices shown, 15 images]
[im 4/48  soft-tissue]
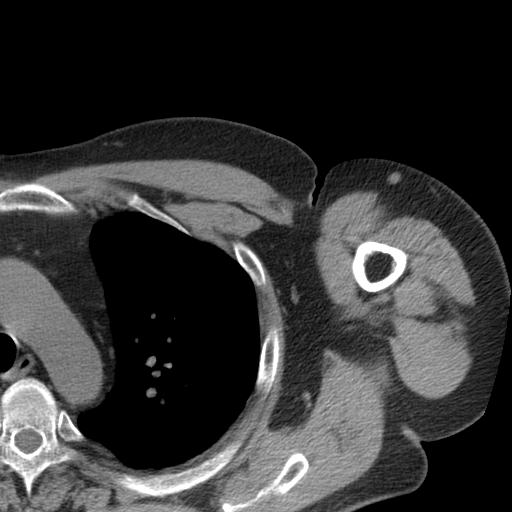
[im 4/48  bone]
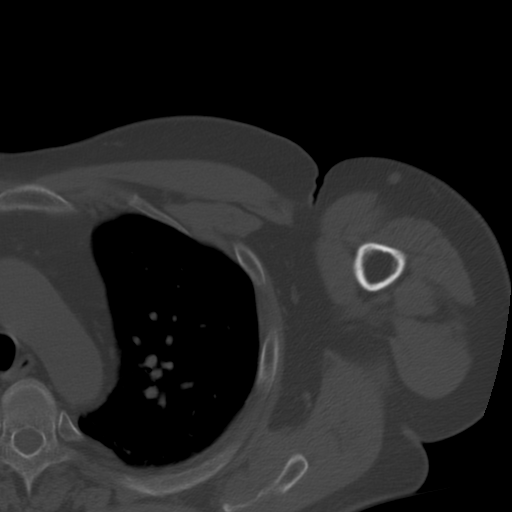
[im 8/48  bone]
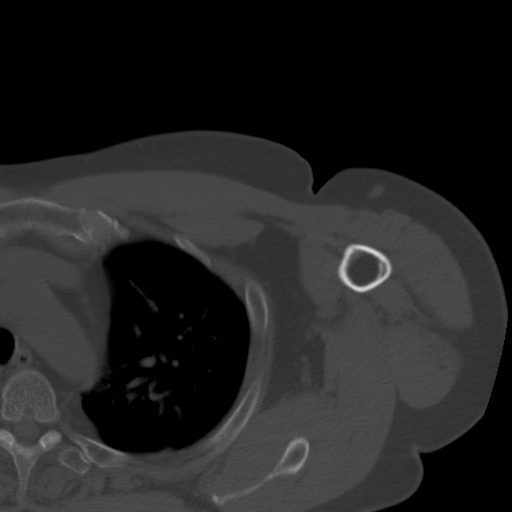
[im 11/48  bone]
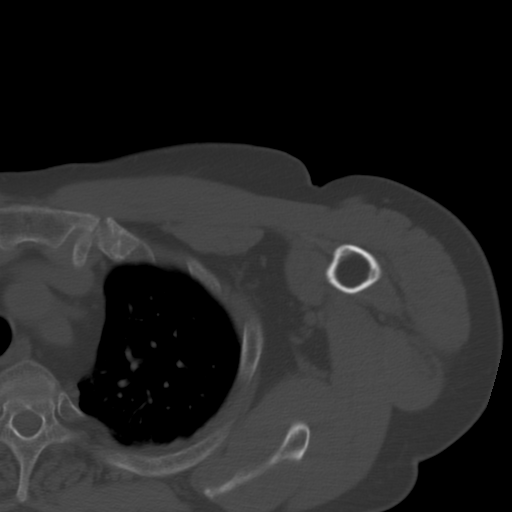
[im 15/48  bone]
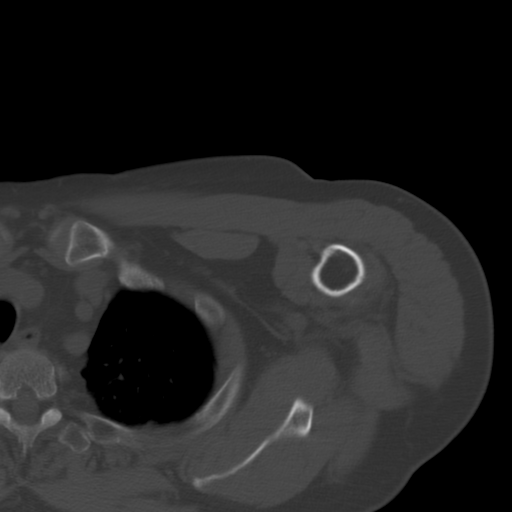
[im 19/48  soft-tissue]
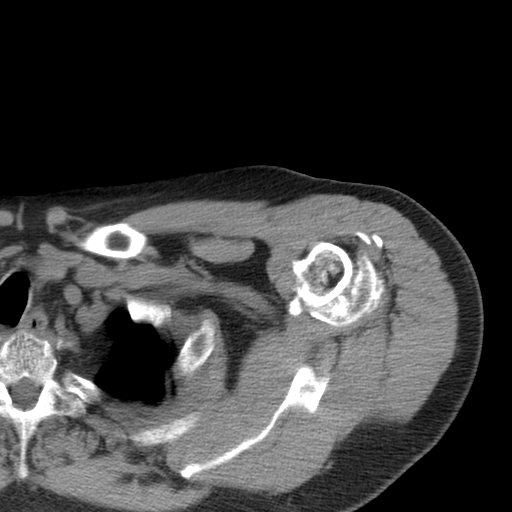
[im 19/48  bone]
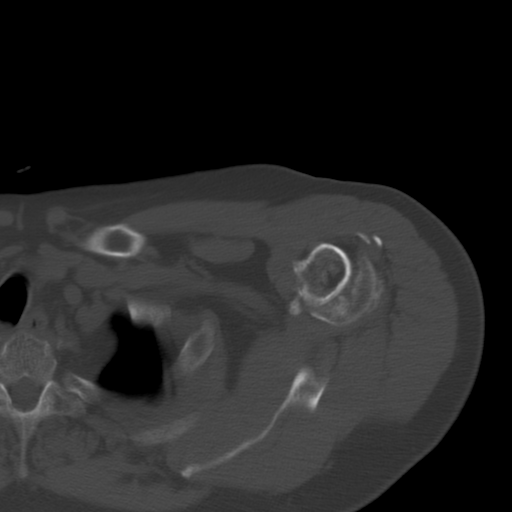
[im 22/48  bone]
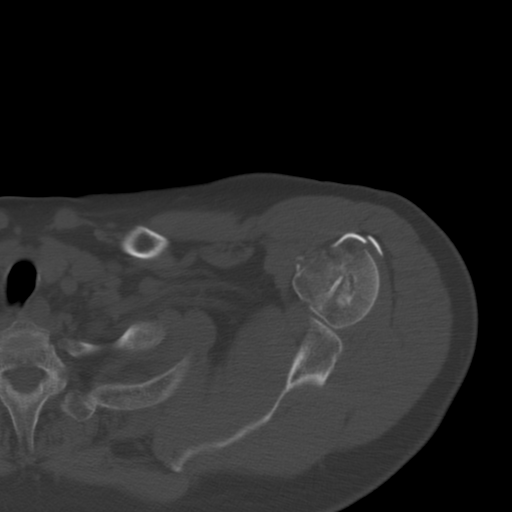
[im 26/48  bone]
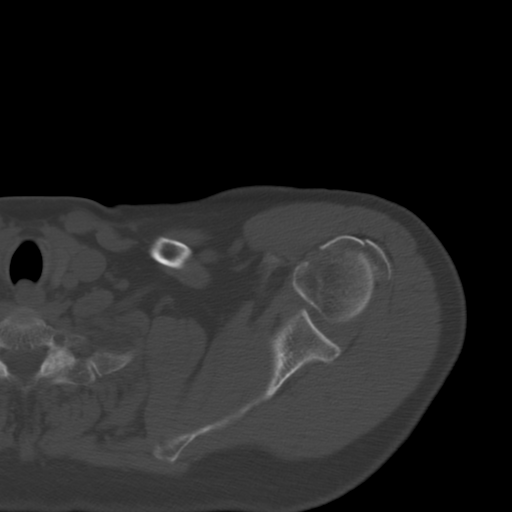
[im 29/48  bone]
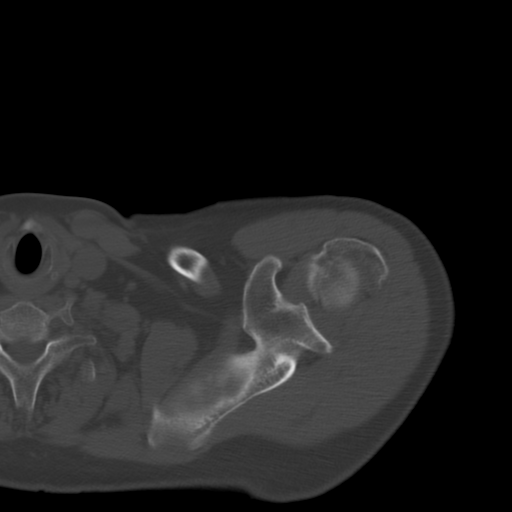
[im 33/48  soft-tissue]
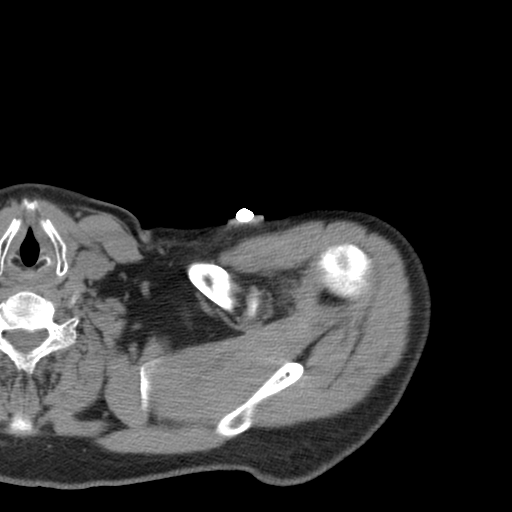
[im 33/48  bone]
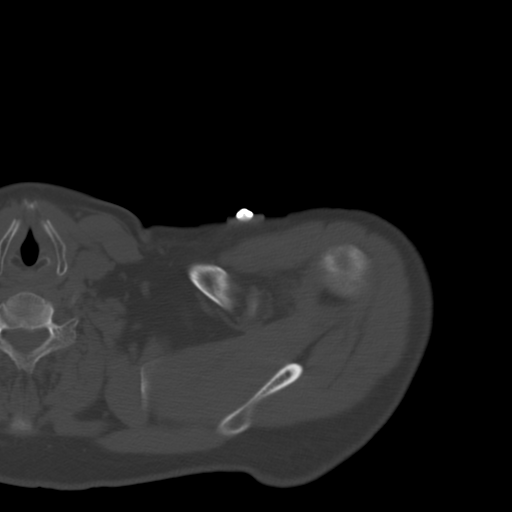
[im 37/48  bone]
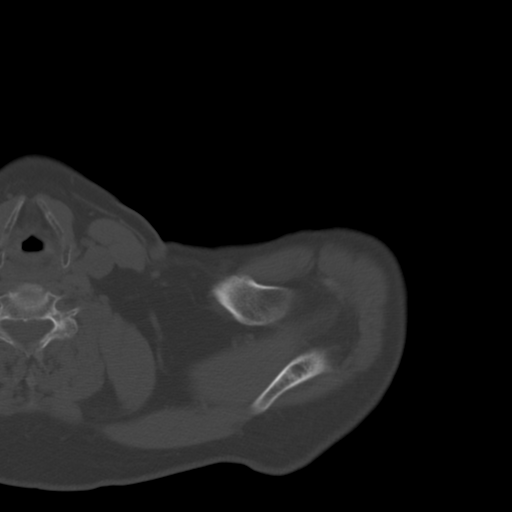
[im 40/48  bone]
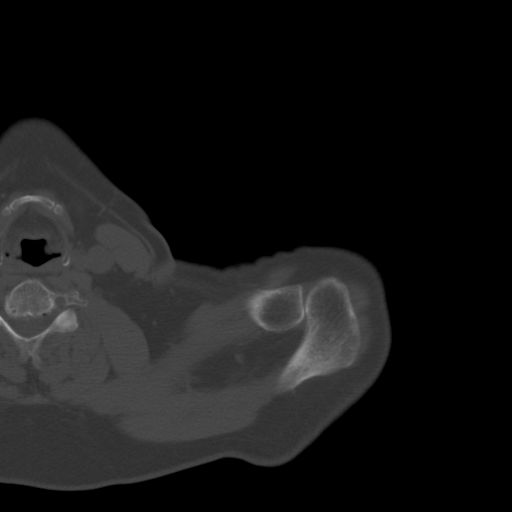
[im 44/48  bone]
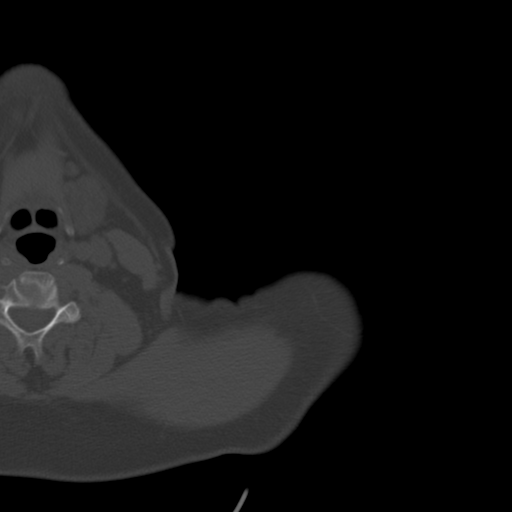

[12 of 14 positions shown; findings below may reference images not displayed]

FINDINGS: Three part surgical neck fracture of the proximal humerus, with the
main the humeral head articular component the rotated sets that its
upper margin is oriented posteriorly and laterally; and with a large
mildly comminuted greater tuberosity fragment displaced about
cm, primarily proximally and laterally. There is a longitudinal
split in the greater tuberosity fragment, as well as a subtle
nondisplaced fracture tracking along the articular portion of the
humeral head component as on image 62/603. There may be a
nondisplaced fracture extending through the lesser tuberosity but
being nondisplaced this does not qualify as a separate part.

No other regional fractures identified.
IMPRESSION: 1. Neer 3-part fracture of the proximal humerus involving the
surgical neck and greater tuberosity. There are linear nondisplaced
fracture components extending through both the humeral head and
greater tuberosity fragments.

## 2016-07-08 IMAGING — CR DG SHOULDER 2+V*L*
2 series · 2 of 2 positions shown · non-contrast
Comparison: None.

CLINICAL DATA: 54-year-old male tripped on curb and fell. Pain.
Initial encounter.

EXAM:
LEFT SHOULDER - 2+ VIEW

[w shoulder external left]
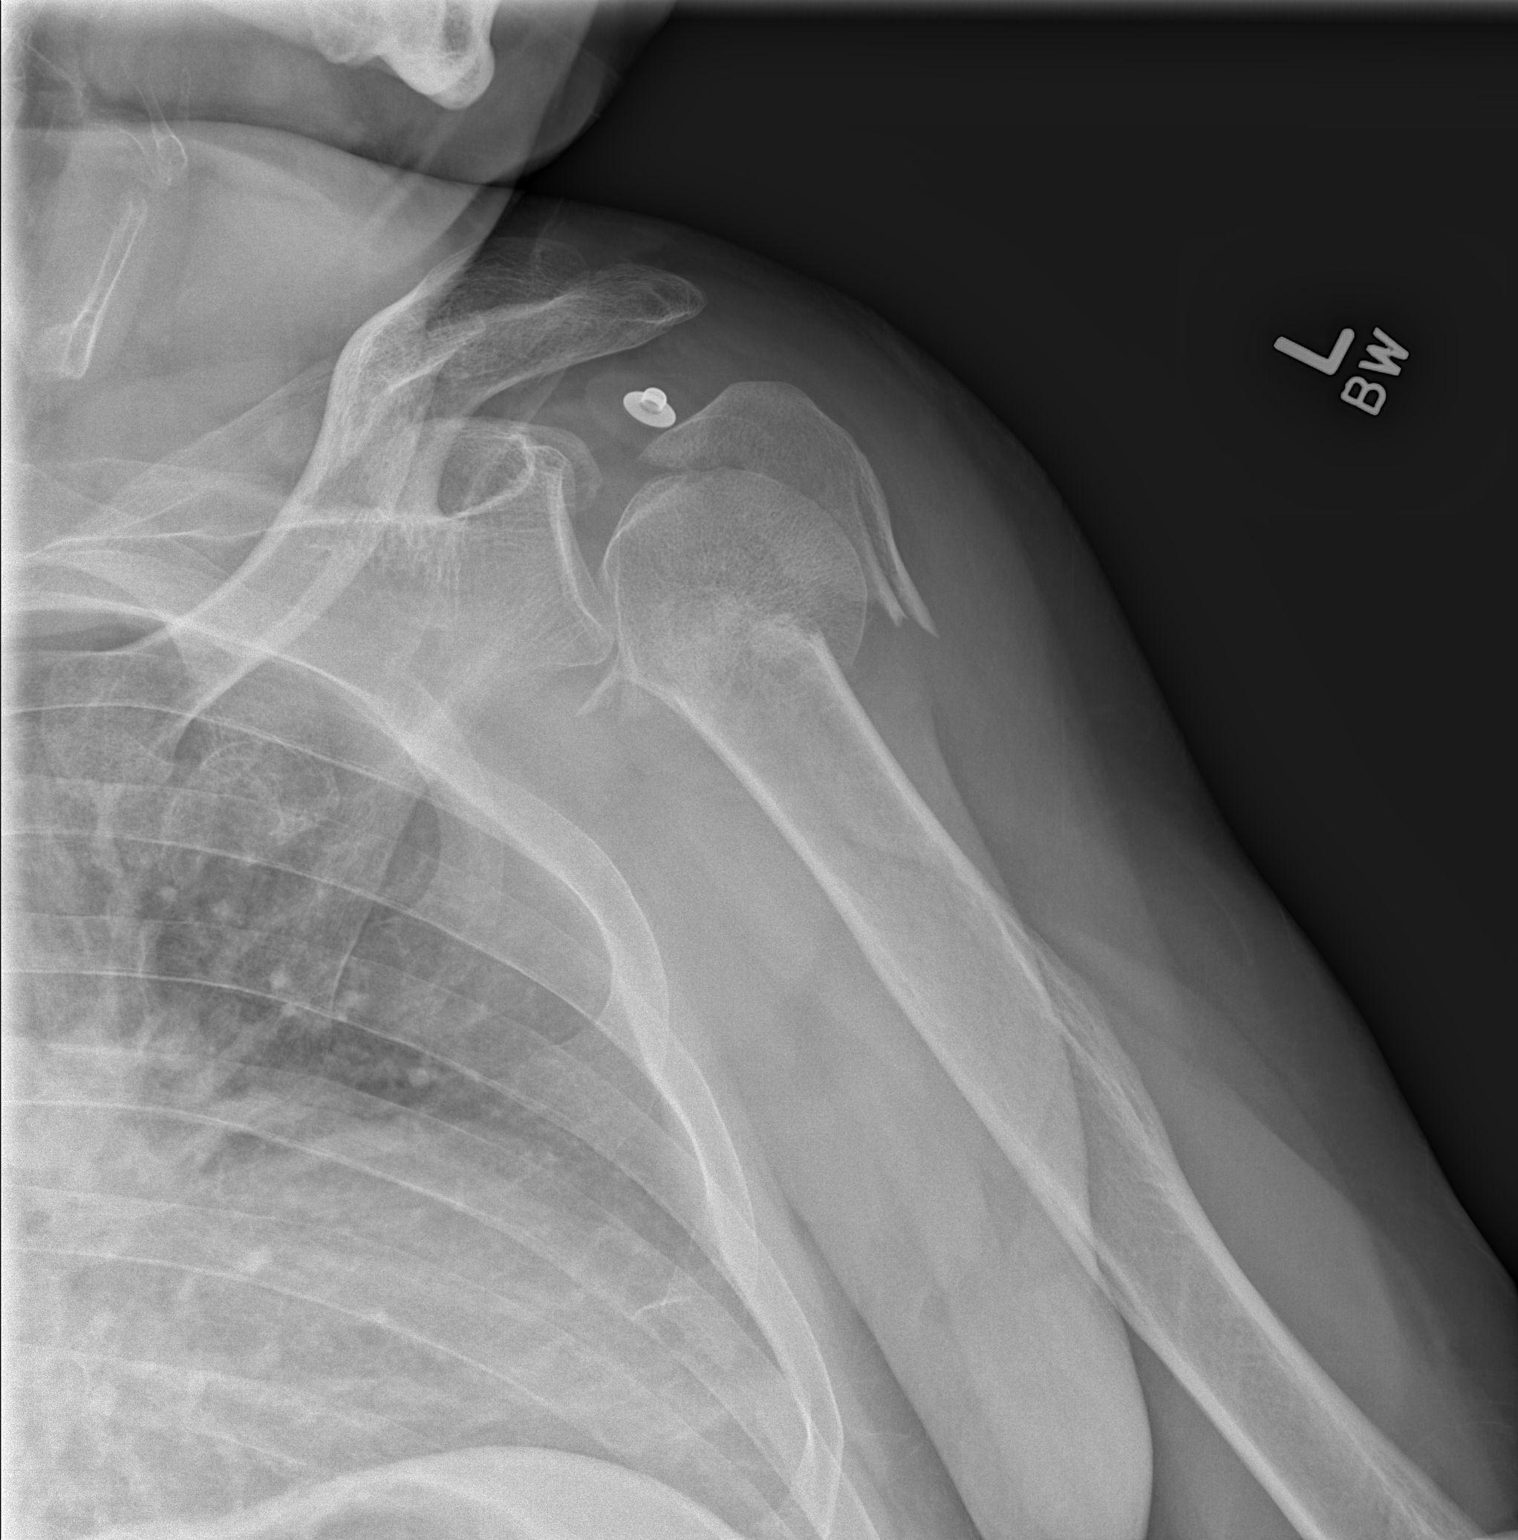

[w shoulder y-view left]
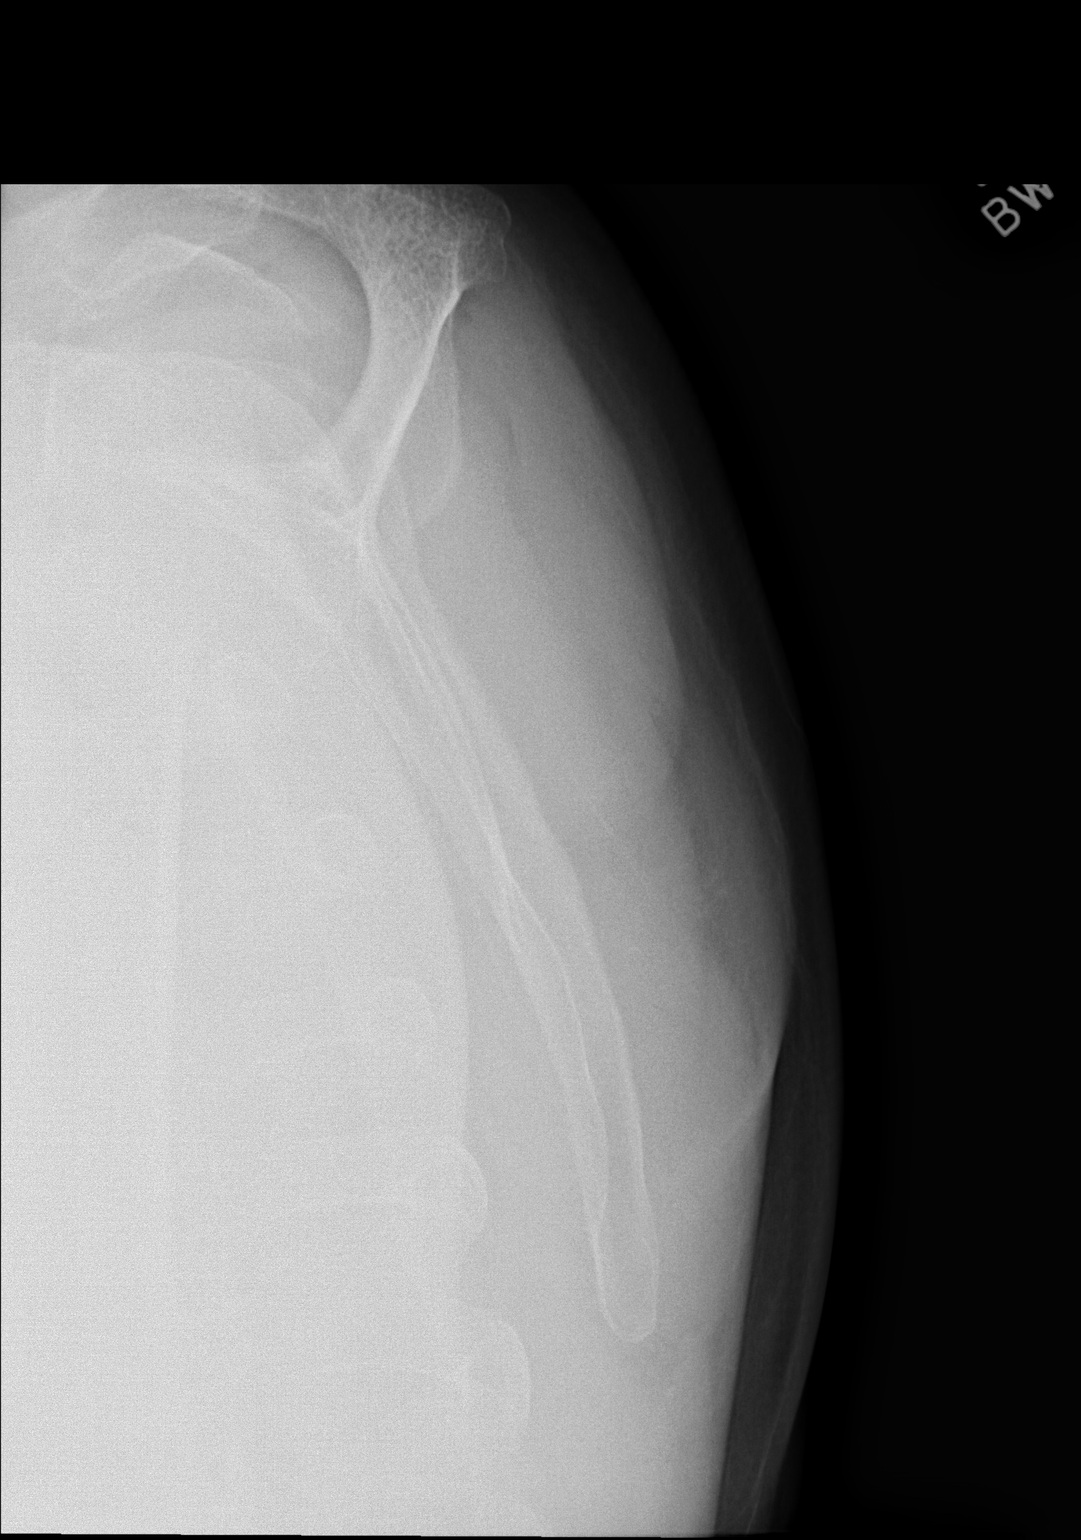

[2 of 2 positions shown; findings below may reference images not displayed]

FINDINGS: Comminuted and impacted proximal right humerus fracture. The greater
tuberosity constitutes a large butterfly fragment. Single scapular
Y-view with suboptimal technique provided. I suspect anterior
glenohumeral dislocation is associated. No scapula or clavicle
fracture identified. Visible left ribs and lung parenchyma within
normal limits.
IMPRESSION: Severely comminuted proximal left humerus fracture with probable
superimposed anterior glenohumeral dislocation.

## 2016-07-14 IMAGING — RF DG C-ARM 61-120 MIN
1 series · 2 of 2 positions shown · non-contrast
Comparison: Radiographs 05/11/2015

CLINICAL DATA: Left shoulder fracture repair.

EXAM:
LEFT HUMERUS - 2+ VIEW; DG C-ARM 61-120 MIN

[Series 1: run · 2 of 2 slices shown]
[im 1/2]
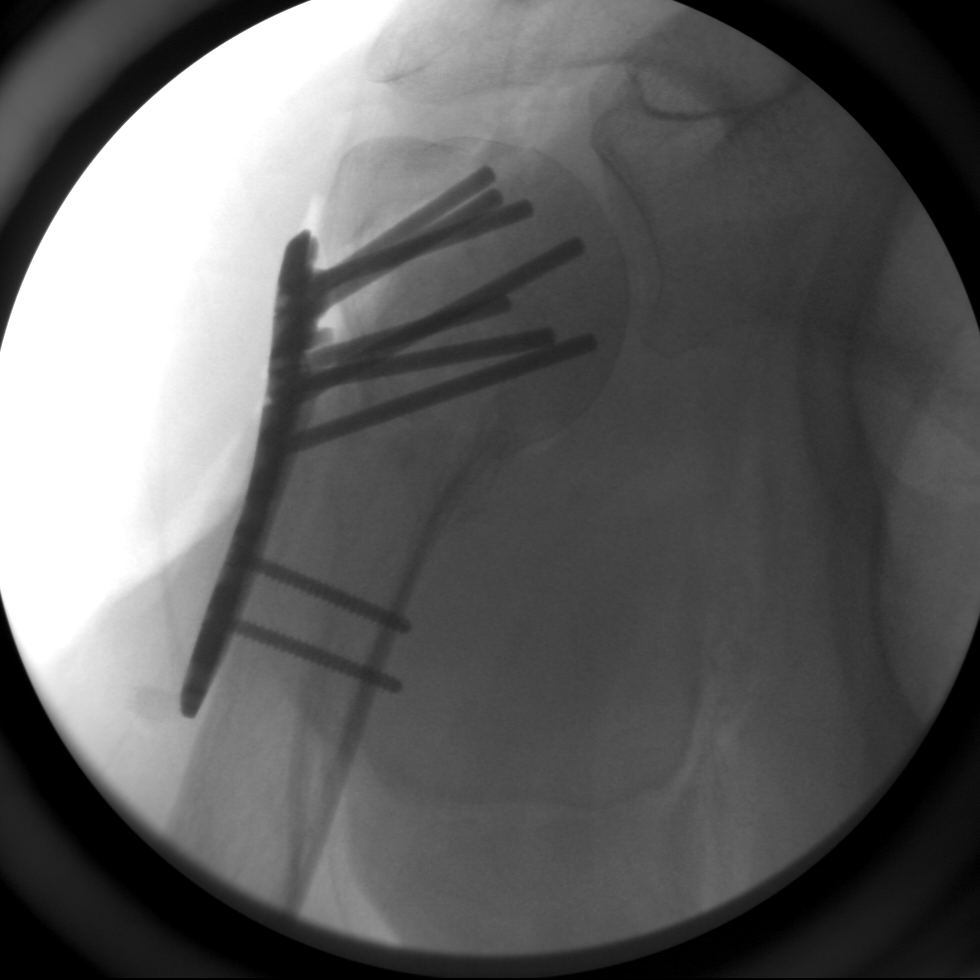
[im 2/2]
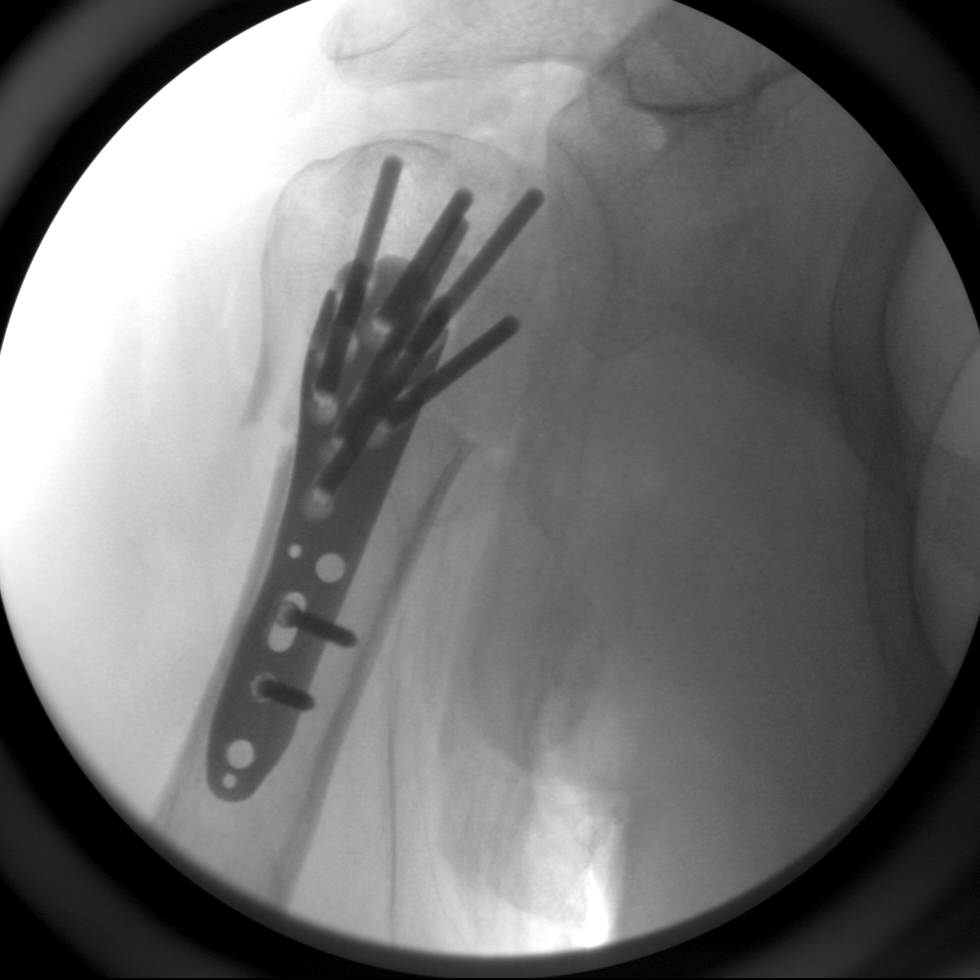

[2 of 2 positions shown; findings below may reference images not displayed]

FINDINGS: There is a long sideplate and numerous screws transfixing the
complex comminuted humeral head/ neck fracture. Near-anatomic
reduction. No complicating features.
IMPRESSION: Internal fixation of humeral head/neck fractures with near-anatomic
alignment and no complicating features.

## 2019-04-03 ENCOUNTER — Ambulatory Visit: Payer: PRIVATE HEALTH INSURANCE | Attending: Internal Medicine

## 2019-04-03 DIAGNOSIS — Z23 Encounter for immunization: Secondary | ICD-10-CM | POA: Insufficient documentation

## 2019-04-03 NOTE — Progress Notes (Signed)
   Covid-19 Vaccination Clinic  Name:  Andrew Ware    MRN: [id number] DOB: [birth year]  04/03/2019  Mr. Andrew Ware was observed post Covid-19 immunization for 15 minutes without incidence. He was provided with Vaccine Information Sheet and instruction to access the V-Safe system.   Mr. Andrew Ware was instructed to call 911 with any severe reactions post vaccine: Marland Kitchen Difficulty breathing  . Swelling of your face and throat  . A fast heartbeat  . A bad rash all over your body  . Dizziness and weakness    Immunizations Administered    Name Date Dose VIS Date Route   Pfizer COVID-19 Vaccine 04/03/2019 10:15 AM 0.3 mL 01/14/2019 Intramuscular   Manufacturer: [employer], [employer]   Lot: [id number]   NDC: [address]-9

## 2019-04-27 ENCOUNTER — Ambulatory Visit: Payer: PRIVATE HEALTH INSURANCE | Attending: Internal Medicine

## 2019-04-27 DIAGNOSIS — Z23 Encounter for immunization: Secondary | ICD-10-CM

## 2019-04-27 NOTE — Progress Notes (Signed)
   Covid-19 Vaccination Clinic  Name:  Tanvir Hipple    MRN: [id number] DOB: Jan 31, 1961  04/27/2019  Mr. Huttner was observed post Covid-19 immunization for 15 minutes without incident. He was provided with Vaccine Information Sheet and instruction to access the V-Safe system.   Mr. Abascal was instructed to call 911 with any severe reactions post vaccine: Marland Kitchen Difficulty breathing  . Swelling of face and throat  . A fast heartbeat  . A bad rash all over body  . Dizziness and weakness   Immunizations Administered    Name Date Dose VIS Date Route   Pfizer COVID-19 Vaccine 04/27/2019  2:56 PM 0.3 mL 01/14/2019 Intramuscular   Manufacturer: [employer], [employer]   Lot: [id number]   NDC: [address]-9

## 2020-05-27 LAB — COLOGUARD: COLOGUARD: POSITIVE — AB

## 2020-12-05 NOTE — Progress Notes (Signed)
New Patient Note  RE: Andrew Ware MRN: [id number] DOB: 1960-06-16 Date of Office Visit: 12/06/2020  Consult requested by: Vivien Presto, MD Primary care provider: Vivien Presto, MD  Chief Complaint: Establish Care, Nasal Congestion, and Cough  History of Present Illness: I had the pleasure of seeing Andrew Ware for initial evaluation at the [facility] on 12/06/2020. He is a 60 y.o. male, who is referred here by Ware, Andrew A, MD for the evaluation of rhinitis.  He reports symptoms of nasal congestion, rhinorrhea, PND, cough, itchy/watery eyes. Symptoms have been going on for 5-7 years but worsening. The symptoms are present all year around with worsening in spring and fall. Other triggers include exposure to none. Anosmia: sometimes. Headache: sometimes. He has used Singulair, zyrtec, Claritin, Flonase/Nasacort 1 spray per nostril daily with some improvement in symptoms. Sinus infections: no. Previous work up includes: not recently but thinks he was allergic to [place] grass. No prior AIT. Previous ENT evaluation: for a sinus infection 15 years ago. No prior sinus surgery. Previous sinus imaging: no. History of nasal polyps: no. Last eye exam: within the past year. History of reflux: currently on omeprazole 40mg  once a day and tagamet prn with good benefit.  10/22/2020 PCP visit: "He also has chronic rhinitis. He is unsure what the cause of it is. He takes Flonase, and antihistamine, and Singulair daily. Some days he has no problem and other days he has issues. He would like to be evaluated by an allergist."  Assessment and Plan: Andrew Ware is a 60 y.o. male with: Chronic rhinitis Perennial rhinoconjunctivitis symptoms for many years but worsening lately.  Tried Singulair, Zyrtec, Claritin, Flonase, Nasacort with some benefit.  No recent allergy evaluation.  No prior sinus surgery.  Takes omeprazole for reflux. Today's skin testing showed: Negative to  indoor/outdoor allergens. He most likely has chronic non-allergic rhinitis.  Use Flonase (fluticasone) nasal spray 1 spray per nostril twice a day as needed for nasal congestion.  Use Atrovent (ipratropium) 0.03% 1-2 sprays per nostril twice a day as needed for runny nose/drainage. Demonstrated proper nasal spray use.  Nasal saline spray (i.e., Simply Saline) or nasal saline lavage (i.e., NeilMed) is recommended as needed and prior to medicated nasal sprays. If no improvement then will refer to ENT next.   Asthma Patient had COVID-19 in January 2022 in May 2022.  Since then he noticed some issues with his breathing with coughing and not being able to take a deep enough breath.  Used to be on ace inhibitor in the past but that was stopped due to coughing. 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 Clinically feeling improved.  He most likely has new onset asthma. Daily controller medication(s): start Airduo Digihaler 1 puff twice a day and rinse mouth afterwards. Sample given. Coupon given.  Continue Singulair (montelukast) 10mg  daily at night. May use albuterol rescue inhaler 2 puffs every 4 to 6 hours as needed for shortness of breath, chest tightness, coughing, and wheezing. Monitor frequency of use.  Get spirometry at next visit.  Gastroesophageal reflux disease See handout for lifestyle and dietary modifications. Continue omeprazole daily. Nothing to eat/drink afterwards for 30 minutes.   Return in about 2 months (around 02/05/2021).  Meds ordered this encounter  Medications   Fluticasone-Salmeterol,sensor, (AIRDUO DIGIHALER) 113-14 MCG/ACT AEPB    Sig: Inhale 1 puff into the lungs in the morning and at bedtime. Rinse mouth after  each use.    Dispense:  1 each    Refill:  5    Please dispense with saving card Group: [id number] BIN: [id number] PCN: [id number]   ipratropium (ATROVENT) 0.03 % nasal spray     Sig: Place 1-2 sprays into both nostrils 2 (two) times daily as needed (nasal drainage).    Dispense:  30 mL    Refill:  5    Lab Orders  No laboratory test(s) ordered today    Other allergy screening: Asthma: no Patient used albuterol once a month since he had Covid-19 in January 2022 and May 2022.  Stopped ace inhibitor and coughing slightly improved.   Food allergy: no Medication allergy: no Hymenoptera allergy: no Large localized reactions.  Urticaria: no Eczema:no History of recurrent infections suggestive of immunodeficency: no  Diagnostics: Spirometry:  Tracings reviewed. His effort: It was hard to get consistent efforts and there is a question as to whether this reflects a maximal maneuver. FVC: 2.68L FEV1: 1.64L, 45% predicted FEV1/FVC ratio: 61% Interpretation: Spirometry consistent with mixed obstructive and restrictive disease with 46% improvement in FEV1 post bronchodilator treatment. Clinically feeling improved.   Please see scanned spirometry results for details.  Skin Testing: Environmental allergy panel. Negative to indoor/outdoor allergens. Results discussed with patient/family.  Airborne Adult Perc - 12/06/20 0934     Time Antigen Placed 6433    Allergen Manufacturer Waynette Buttery    Location Back    Number of Test 59    1. Control-Buffer 50% Glycerol Negative    2. Control-Histamine 1 mg/ml 2+    3. Albumin saline Negative    4. Bahia Negative    5. [place] Negative    6. Johnson Negative    7. Kentucky Blue Negative    8. Meadow Fescue Negative    9. Perennial Rye Negative    10. Sweet Vernal Negative    11. Timothy Negative    12. Cocklebur Negative    13. Burweed Marshelder Negative    14. Ragweed, short Negative    15. Ragweed, Giant Negative    16. Plantain,  English Negative    17. Lamb's Quarters Negative    18. Sheep Sorrell Negative    19. Rough Pigweed Negative    20. Marsh Elder, Rough Negative    21. Mugwort, Common Negative    22.  Ash mix Negative    23. Birch mix Negative    24. Beech American Negative    25. Box, Elder Negative    26. Cedar, red Negative    27. Cottonwood, Guinea-Bissau Negative    28. Elm mix Negative    29. Hickory Negative    30. Maple mix Negative    31. Oak, [place] mix Negative    32. Pecan Pollen Negative    33. Pine mix Negative    34. Sycamore Eastern Negative    35. Walnut, Black Pollen Negative    36. Alternaria alternata Negative    37. Cladosporium Herbarum Negative    38. Aspergillus mix Negative    39. Penicillium mix Negative    40. Bipolaris sorokiniana (Helminthosporium) Negative    41. Drechslera spicifera (Curvularia) Negative    42. Mucor plumbeus Negative    43. Fusarium moniliforme Negative    44. Aureobasidium pullulans (pullulara) Negative    45. Rhizopus oryzae Negative    46. Botrytis cinera Negative    47. Epicoccum nigrum Negative    48. Phoma betae Negative    49. Candida Albicans Negative  50. Trichophyton mentagrophytes Negative    51. Mite, D Farinae  5,000 AU/ml Negative    52. Mite, D Pteronyssinus  5,000 AU/ml Negative    53. Cat Hair 10,000 BAU/ml Negative    54.  Dog Epithelia Negative    55. Mixed Feathers Negative    56. Horse Epithelia Negative    57. Cockroach, German Negative    58. Mouse Negative    59. Tobacco Leaf Negative             Intradermal - 12/06/20 1027     Time Antigen Placed 1015    Allergen Manufacturer Waynette Buttery    Location Arm    Number of Test 15    Control Negative    [place] Negative    Johnson Negative    7 Grass Negative    Ragweed mix Negative    Weed mix Negative    Tree mix Negative    Mold 1 Negative    Mold 2 Negative    Mold 3 Negative    Mold 4 Negative    Cat Negative    Dog Negative    Cockroach Negative    Mite mix Negative             Past Medical History: Patient Active Problem List   Diagnosis Date Noted   Asthma 12/06/2020   Chronic rhinitis 12/06/2020   Gastroesophageal reflux  disease 12/06/2020    Past Medical History:  Diagnosis Date   Dysrhythmia    GERD (gastroesophageal reflux disease)    Hyperlipidemia    Proximal humerus fracture    left   PVC (premature ventricular contraction)    Past Surgical History: Past Surgical History:  Procedure Laterality Date   ORIF HUMERUS FRACTURE Left 05/17/2015   Procedure: OPEN REDUCTION INTERNAL FIXATION (ORIF) LEFT PROXIMAL HUMERUS FRACTURE;  Surgeon: Sheral Apley, MD;  Location: [facility];  Service: Orthopedics;  Laterality: Left;   Medication List:  Current Outpatient Medications  Medication Sig Dispense Refill   atorvastatin (LIPITOR) 10 MG tablet Take 10 mg by mouth daily.     bisoprolol (ZEBETA) 10 MG tablet Take 10 mg by mouth daily.     diazepam (VALIUM) 5 MG tablet Take 5 mg by mouth every 6 (six) hours as needed for anxiety.     Fluticasone-Salmeterol,sensor, (AIRDUO DIGIHALER) 113-14 MCG/ACT AEPB Inhale 1 puff into the lungs in the morning and at bedtime. Rinse mouth after each use. 1 each 5   ipratropium (ATROVENT) 0.03 % nasal spray Place 1-2 sprays into both nostrils 2 (two) times daily as needed (nasal drainage). 30 mL 5   metoprolol (TOPROL-XL) 200 MG 24 hr tablet Take 1 tablet by mouth daily.     montelukast (SINGULAIR) 10 MG tablet Take 10 mg by mouth daily.     Multiple Vitamins-Minerals (MULTIVITAMIN & MINERAL PO) Take 1 tablet by mouth daily.     olmesartan (BENICAR) 20 MG tablet TAKE ONE TABLET BY MOUTH 2 TIMES DAILY.     Omega-3 Fatty Acids (FISH OIL) 1000 MG CAPS Take 1 capsule by mouth daily.     omeprazole (PRILOSEC) 40 MG capsule Take 40 mg by mouth daily.     oxyCODONE-acetaminophen (ROXICET) 5-325 MG tablet Take 2 tablets by mouth every 4 (four) hours as needed. 60 tablet 0   aspirin 81 MG EC tablet Take by mouth.     Lutein 6 MG CAPS Take by mouth.     meloxicam (MOBIC) 15 MG tablet Take  15 mg by mouth daily.     No current facility-administered medications for  this visit.   Allergies: No Known Allergies Social History: Social History   Socioeconomic History   Marital status: Married    Spouse name: Not on file   Number of children: Not on file   Years of education: Not on file   Highest education level: Not on file  Occupational History   Not on file  Tobacco Use   Smoking status: Never   Smokeless tobacco: Not on file  Substance and Sexual Activity   Alcohol use: Yes    Comment: social   Drug use: No   Sexual activity: Not on file  Other Topics Concern   Not on file  Social History Narrative   Not on file   Social Determinants of Health   Financial Resource Strain: Not on file  Food Insecurity: Not on file  Transportation Needs: Not on file  Physical Activity: Not on file  Stress: Not on file  Social Connections: Not on file   Lives in a 61 year old house. Smoking: denies Occupation: Psychologist, counselling HistorySurveyor, minerals in the house: no Carpet in the family room: no Carpet in the bedroom: yes Heating: gas Cooling: central Pet: no  Family History: History reviewed. No pertinent family history. Problem                               Relation Asthma                                   Daughter  Eczema                                no Food allergy                          no Allergic rhino conjunctivitis     no  Review of Systems  Constitutional:  Negative for appetite change, chills, fever and unexpected weight change.  HENT:  Positive for congestion, postnasal drip and rhinorrhea.   Eyes:  Negative for itching.  Respiratory:  Positive for cough. Negative for chest tightness, shortness of breath and wheezing.   Cardiovascular:  Negative for chest pain.  Gastrointestinal:  Negative for abdominal pain.  Genitourinary:  Negative for difficulty urinating.  Skin:  Negative for rash.  Allergic/Immunologic: Negative for environmental allergies.  Neurological:  Negative for headaches.    Objective: BP 124/76   Pulse 69   Temp 97.8 F (36.6 C) (Temporal)   Resp 16   Ht 5\' 10"  (1.778 m)   Wt 207 lb (93.9 kg)   SpO2 99%   BMI 29.70 kg/m  Body mass index is 29.7 kg/m. Physical Exam Vitals and nursing note reviewed.  Constitutional:      Appearance: Normal appearance. He is well-developed.  HENT:     Head: Normocephalic and atraumatic.     Right Ear: Tympanic membrane and external ear normal.     Left Ear: Tympanic membrane and external ear normal.     Nose: Congestion present.     Mouth/Throat:     Mouth: Mucous membranes are moist.     Pharynx: Oropharynx is clear.  Eyes:     Conjunctiva/sclera: Conjunctivae normal.  Cardiovascular:  Rate and Rhythm: Normal rate and regular rhythm.     Heart sounds: Normal heart sounds. No murmur heard.   No friction rub. No gallop.  Pulmonary:     Effort: Pulmonary effort is normal.     Breath sounds: Normal breath sounds. No wheezing, rhonchi or rales.  Musculoskeletal:     Cervical back: Neck supple.  Skin:    General: Skin is warm.     Findings: No rash.  Neurological:     Mental Status: He is alert and oriented to person, place, and time.  Psychiatric:        Behavior: Behavior normal.  The plan was reviewed with the patient/family, and all questions/concerned were addressed.  It was my pleasure to see Antrone today and participate in his care. Please feel free to contact me with any questions or concerns.  Sincerely,  Wyline Mood, DO Allergy & Immunology  [facility] office: [phone] [facility] office: [phone]

## 2020-12-06 ENCOUNTER — Encounter: Payer: Self-pay | Admitting: Allergy

## 2020-12-06 ENCOUNTER — Ambulatory Visit (INDEPENDENT_AMBULATORY_CARE_PROVIDER_SITE_OTHER): Payer: BC Managed Care – PPO | Admitting: Allergy

## 2020-12-06 ENCOUNTER — Other Ambulatory Visit: Payer: Self-pay

## 2020-12-06 VITALS — BP 124/76 | HR 69 | Temp 97.8°F | Resp 16 | Ht 70.0 in | Wt 207.0 lb

## 2020-12-06 DIAGNOSIS — J45909 Unspecified asthma, uncomplicated: Secondary | ICD-10-CM

## 2020-12-06 DIAGNOSIS — J310 Chronic rhinitis: Secondary | ICD-10-CM | POA: Diagnosis not present

## 2020-12-06 DIAGNOSIS — K219 Gastro-esophageal reflux disease without esophagitis: Secondary | ICD-10-CM

## 2020-12-06 MED ORDER — IPRATROPIUM BROMIDE 0.03 % NA SOLN: 1.0000 | Freq: Two times a day (BID) | NASAL | 5 refills | Status: DC | PRN

## 2020-12-06 MED ORDER — AIRDUO DIGIHALER 113-14 MCG/ACT IN AEPB: 1.0000 | INHALATION_SPRAY | Freq: Two times a day (BID) | RESPIRATORY_TRACT | 5 refills | Status: DC

## 2020-12-06 NOTE — Assessment & Plan Note (Addendum)
Patient had COVID-19 in January 2022 in May 2022.  Since then he noticed some issues with his breathing with coughing and not being able to take a deep enough breath.  Used to be on ace inhibitor in the past but that was stopped due to coughing.  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 Clinically feeling improved.   He most likely has new onset asthma. . Daily controller medication(s): start Airduo Digihaler 1 puff twice a day and rinse mouth afterwards. Sample given. Coupon given.  o Continue Singulair (montelukast) 10mg  daily at night. . May use albuterol rescue inhaler 2 puffs every 4 to 6 hours as needed for shortness of breath, chest tightness, coughing, and wheezing. Monitor frequency of use.  . Get spirometry at next visit.

## 2020-12-06 NOTE — Patient Instructions (Addendum)
Today's skin testing showed: Negative to indoor/outdoor allergens. Results given.  Rhinitis: You most likely have non-allergic rhinitis.  Use Flonase (fluticasone) nasal spray 1 spray per nostril twice a day as needed for nasal congestion.  Use Atrovent (ipratropium) 0.03% 1-2 sprays per nostril twice a day as needed for runny nose/drainage. Nasal saline spray (i.e., Simply Saline) or nasal saline lavage (i.e., NeilMed) is recommended as needed and prior to medicated nasal sprays. If no improvement then will refer to ENT next.   Breathing: Daily controller medication(s): start Airduo Digihaler 1 puff twice a day and rinse mouth afterwards. Sample given. Coupon given.  Continue Singulair (montelukast) 10mg  daily at night. May use albuterol rescue inhaler 2 puffs every 4 to 6 hours as needed for shortness of breath, chest tightness, coughing, and wheezing. Monitor frequency of use.  Asthma control goals:  Full participation in all desired activities (may need albuterol before activity) Albuterol use two times or less a week on average (not counting use with activity) Cough interfering with sleep two times or less a month Oral steroids no more than once a year No hospitalizations   Heartburn/reflux: See handout for lifestyle and dietary modifications. Continue omeprazole daily. Nothing to eat/drink afterwards for 30 minutes.   Follow up in 2 months or sooner if needed.  Buffered Isotonic Saline Irrigations:  Goal: When you irrigate with the isotonic saline (salt water) it washes mucous and other debris from your nose that could be contributing to your nasal symptoms.   Recipe: Obtain 1 quart jar that is clean Fill with clean (bottled, boiled or distilled) water Add 1-2 heaping teaspoons of salt without iodine If the solution with 2 teaspoons of salt is too strong, adjust the amount down until better tolerated Add 1 teaspoon of Arm & Hammer baking soda (pure bicarbonate) Mix  ingredients together and store at room temperature and discard after 1 week * Alternatively you can buy pre made salt packets for the NeilMed bottle or there          are other over the counter brands available  Instructions: Warm  cup of the solution in the microwave if desired but be careful not to overheat as this will burn the inside of your nose Stand over a sink (or do it while you shower) and squirt the solution into one side of your nose aiming towards the back of your head Sometimes saying "coca cola" while irrigating can be helpful to prevent fluid from going down your throat  The solution will travel to the back of your nose and then come out the other side Perform this again on the other side Try to do this twice a day If you are using a nasal spray in addition to the irrigation, irrigate first and then use the topical nasal spray otherwise you will wash the nasal spray out of your nose

## 2020-12-06 NOTE — Assessment & Plan Note (Signed)
   See handout for lifestyle and dietary modifications.  Continue omeprazole daily. Nothing to eat/drink afterwards for 30 minutes.

## 2020-12-06 NOTE — Assessment & Plan Note (Signed)
Perennial rhinoconjunctivitis symptoms for many years but worsening lately.  Tried Singulair, Zyrtec, Claritin, Flonase, Nasacort with some benefit.  No recent allergy evaluation.  No prior sinus surgery.  Takes omeprazole for reflux.  Today's skin testing showed: Negative to indoor/outdoor allergens.  He most likely has chronic non-allergic rhinitis.  . Use Flonase (fluticasone) nasal spray 1 spray per nostril twice a day as needed for nasal congestion.   Use Atrovent (ipratropium) 0.03% 1-2 sprays per nostril twice a day as needed for runny nose/drainage.  Demonstrated proper nasal spray use.   Nasal saline spray (i.e., Simply Saline) or nasal saline lavage (i.e., NeilMed) is recommended as needed and prior to medicated nasal sprays.  If no improvement then will refer to ENT next.

## 2020-12-07 ENCOUNTER — Telehealth: Payer: Self-pay | Admitting: Allergy

## 2020-12-07 MED ORDER — ALBUTEROL SULFATE HFA 108 (90 BASE) MCG/ACT IN AERS: 2.0000 | INHALATION_SPRAY | RESPIRATORY_TRACT | 1 refills | Status: DC | PRN

## 2020-12-07 NOTE — Telephone Encounter (Signed)
Patient is requesting a refill on his albuterol inhaler.   Best pharmacy- [facility] in [place]

## 2020-12-07 NOTE — Telephone Encounter (Signed)
Spoke with patient informed him that refills have been sent to the requested pharmacy. Patient verbalized understanding.

## 2021-02-06 NOTE — Progress Notes (Signed)
Follow Up Note  RE: Andrew Ware MRN: [id number] DOB: 1960-11-07 Date of Office Visit: 02/07/2021  Referring provider: Vivien Presto, MD Primary care provider: Vivien Presto, MD  Chief Complaint: Allergic Rhinitis  (Pt states he is doing a lot better then before)  History of Present Illness: I had the pleasure of seeing Andrew Ware for a follow up visit at the [facility] on 02/07/2021. He is a 61 y.o. male, who is being followed for nonallergic rhinitis, asthma and GERD. His previous allergy office visit was on 12/06/2020 with Dr. Selena Batten. Today is a regular follow up visit.  Chronic rhinitis Currently on Atrovent 2 sprays per nostril twice a day and Flonase 1 spray per nostril twice a day. No nosebleeds. This regimen has been helping.  No coughing fits anymore. Still has some nasal/chest congestion at times though. Sense of smell improved.   Asthma Currently on Airduo 1 puff twice a day and noted improvement in his breathing especially the last 1-2 weeks. Only used albuterol once the last 1-2 weeks. Stopped Singulair with no change in symptoms.   Denies any SOB, wheezing, chest tightness, nocturnal awakenings, ER/urgent care visits or prednisone use since the last visit.   Gastroesophageal reflux disease Taking omeprazole daily with good benefit.    Assessment and Plan: Andrew Ware is a 61 y.o. male with: Asthma Past history - Patient had COVID-19 in January 2022 and May 2022.  Since then he noticed issues with coughing and not being able to take a deep enough breath.  Ace inhibitor stopped due to coughing. 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 Clinically feeling improved.  Interim history - significant improvement with Airduo. Stopped Singulair with no worsening symptoms. Today' spirometry showed some mild restriction. Daily controller medication(s): Airduo Digihaler 1  puff twice a day and rinse mouth afterwards.  Okay to stop Singulair.  May use albuterol rescue inhaler 2 puffs every 4 to 6 hours as needed for shortness of breath, chest tightness, coughing, and wheezing. Monitor frequency of use.  Get spirometry at next visit.  Nonallergic rhinitis Past history - Perennial rhinoconjunctivitis symptoms for many years but worsening lately.  Tried Singulair, Zyrtec, Claritin, Flonase, Nasacort with some benefit. No prior sinus surgery.  Takes omeprazole for reflux. 2022 skin testing showed: Negative to indoor/outdoor allergens. Interim history - improved with nasal sprays.  Use Flonase (fluticasone) or Nasacort nasal spray 1 spray per nostril twice a day as needed for nasal congestion.  Use Atrovent (ipratropium) 0.03% 1-2 sprays per nostril twice a day as needed for runny nose/drainage. Nasal saline spray (i.e., Simply Saline) or nasal saline lavage (i.e., NeilMed) is recommended as needed and prior to medicated nasal sprays. If no improvement then will refer to ENT next.   Gastroesophageal reflux disease Stable. Continue lifestyle and dietary modifications. Continue omeprazole daily. Nothing to eat/drink afterwards for 30 minutes.   Return in about 3 months (around 05/08/2021).  No orders of the defined types were placed in this encounter.  Lab Orders  No laboratory test(s) ordered today    Diagnostics: Spirometry:  Tracings reviewed. His effort: Good reproducible efforts. FVC: 3.31L FEV1: 2.59L, 73% predicted FEV1/FVC ratio: 76% Interpretation: Spirometry consistent with possible restrictive disease.  Please see scanned spirometry results for details.  Medication List:  Current Outpatient Medications  Medication Sig Dispense Refill   albuterol (VENTOLIN HFA) 108 (90 Base) MCG/ACT inhaler Inhale 2 puffs into the  lungs every 4 (four) hours as needed for wheezing or shortness of breath. 18 g 1   aspirin 81 MG EC tablet Take by mouth.      atorvastatin (LIPITOR) 10 MG tablet Take 10 mg by mouth daily.     Fluticasone-Salmeterol,sensor, (AIRDUO DIGIHALER) 113-14 MCG/ACT AEPB Inhale 1 puff into the lungs in the morning and at bedtime. Rinse mouth after each use. 1 each 5   ipratropium (ATROVENT) 0.03 % nasal spray Place 1-2 sprays into both nostrils 2 (two) times daily as needed (nasal drainage). 30 mL 5   Lutein 6 MG CAPS Take by mouth.     meloxicam (MOBIC) 15 MG tablet Take 15 mg by mouth as needed.     metoprolol (TOPROL-XL) 200 MG 24 hr tablet Take 1 tablet by mouth daily.     Multiple Vitamins-Minerals (MULTIVITAMIN & MINERAL PO) Take 1 tablet by mouth daily.     olmesartan (BENICAR) 20 MG tablet TAKE ONE TABLET BY MOUTH 2 TIMES DAILY.     Omega-3 Fatty Acids (FISH OIL) 1000 MG CAPS Take 1 capsule by mouth daily.     omeprazole (PRILOSEC) 40 MG capsule Take 40 mg by mouth daily.     No current facility-administered medications for this visit.   Allergies: No Known Allergies I reviewed his past medical history, social history, family history, and environmental history and no significant changes have been reported from his previous visit.  Review of Systems  Constitutional:  Negative for appetite change, chills, fever and unexpected weight change.  HENT:  Negative for congestion and postnasal drip.   Eyes:  Negative for itching.  Respiratory:  Negative for cough, chest tightness, shortness of breath and wheezing.   Cardiovascular:  Negative for chest pain.  Gastrointestinal:  Negative for abdominal pain.  Genitourinary:  Negative for difficulty urinating.  Skin:  Negative for rash.  Allergic/Immunologic: Negative for environmental allergies.  Neurological:  Negative for headaches.   Objective: BP 124/80    Pulse 75    Temp 97.6 F (36.4 C) (Temporal)    Resp 16    SpO2 100%  There is no height or weight on file to calculate BMI. Physical Exam Vitals and nursing note reviewed.  Constitutional:      Appearance:  Normal appearance. He is well-developed.  HENT:     Head: Normocephalic and atraumatic.     Right Ear: Tympanic membrane and external ear normal.     Left Ear: Tympanic membrane and external ear normal.     Nose: Nose normal.     Mouth/Throat:     Mouth: Mucous membranes are moist.     Pharynx: Oropharynx is clear.  Eyes:     Conjunctiva/sclera: Conjunctivae normal.  Cardiovascular:     Rate and Rhythm: Normal rate and regular rhythm.     Heart sounds: Normal heart sounds. No murmur heard.   No friction rub. No gallop.  Pulmonary:     Effort: Pulmonary effort is normal.     Breath sounds: Normal breath sounds. No wheezing, rhonchi or rales.  Musculoskeletal:     Cervical back: Neck supple.  Skin:    General: Skin is warm.     Findings: No rash.  Neurological:     Mental Status: He is alert and oriented to person, place, and time.  Psychiatric:        Behavior: Behavior normal.   Previous notes and tests were reviewed. The plan was reviewed with the patient/family, and all questions/concerned were addressed.  It was my pleasure to see Andrew Ware today and participate in his care. Please feel free to contact me with any questions or concerns.  Sincerely,  Andrew MoodYoon Geral Tuch, DO Allergy & Immunology  [facility] [place] office: [phone] [facility] [place] office: [phone]

## 2021-02-07 ENCOUNTER — Ambulatory Visit (INDEPENDENT_AMBULATORY_CARE_PROVIDER_SITE_OTHER): Payer: BC Managed Care – PPO | Admitting: Allergy

## 2021-02-07 ENCOUNTER — Other Ambulatory Visit: Payer: Self-pay

## 2021-02-07 ENCOUNTER — Encounter: Payer: Self-pay | Admitting: Allergy

## 2021-02-07 VITALS — BP 124/80 | HR 75 | Temp 97.6°F | Resp 16

## 2021-02-07 DIAGNOSIS — K219 Gastro-esophageal reflux disease without esophagitis: Secondary | ICD-10-CM | POA: Diagnosis not present

## 2021-02-07 DIAGNOSIS — J310 Chronic rhinitis: Secondary | ICD-10-CM | POA: Diagnosis not present

## 2021-02-07 DIAGNOSIS — J4540 Moderate persistent asthma, uncomplicated: Secondary | ICD-10-CM

## 2021-02-07 NOTE — Assessment & Plan Note (Signed)
Past history - Patient had COVID-19 in January 2022 and May 2022.  Since then he noticed issues with coughing and not being able to take a deep enough breath.  Ace inhibitor stopped due to coughing. 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 Clinically feeling improved.  Interim history - significant improvement with Airduo. Stopped Singulair with no worsening symptoms.  Today' spirometry showed some mild restriction.  Daily controller medication(s): Airduo Digihaler 1 puff twice a day and rinse mouth afterwards.   Okay to stop Singulair.   May use albuterol rescue inhaler 2 puffs every 4 to 6 hours as needed for shortness of breath, chest tightness, coughing, and wheezing. Monitor frequency of use.   Get spirometry at next visit.

## 2021-02-07 NOTE — Assessment & Plan Note (Signed)
Stable.  Continue lifestyle and dietary modifications.  Continue omeprazole daily. Nothing to eat/drink afterwards for 30 minutes.

## 2021-02-07 NOTE — Assessment & Plan Note (Signed)
Past history - Perennial rhinoconjunctivitis symptoms for many years but worsening lately.  Tried Singulair, Zyrtec, Claritin, Flonase, Nasacort with some benefit. No prior sinus surgery.  Takes omeprazole for reflux. 2022 skin testing showed: Negative to indoor/outdoor allergens. Interim history - improved with nasal sprays.   Use Flonase (fluticasone) or Nasacort nasal spray 1 spray per nostril twice a day as needed for nasal congestion.   Use Atrovent (ipratropium) 0.03% 1-2 sprays per nostril twice a day as needed for runny nose/drainage.  Nasal saline spray (i.e., Simply Saline) or nasal saline lavage (i.e., NeilMed) is recommended as needed and prior to medicated nasal sprays.  If no improvement then will refer to ENT next.

## 2021-02-07 NOTE — Patient Instructions (Addendum)
Rhinitis: Use Flonase (fluticasone) or Nasacort nasal spray 1 spray per nostril twice a day as needed for nasal congestion.  Use Atrovent (ipratropium) 0.03% 1-2 sprays per nostril twice a day as needed for runny nose/drainage. Nasal saline spray (i.e., Simply Saline) or nasal saline lavage (i.e., NeilMed) is recommended as needed and prior to medicated nasal sprays. If no improvement then will refer to ENT next.   Breathing: Daily controller medication(s): Airduo Digihaler 161mcg 1 puff twice a day and rinse mouth afterwards.  Okay to stop Singulair.  May use albuterol rescue inhaler 2 puffs every 4 to 6 hours as needed for shortness of breath, chest tightness, coughing, and wheezing. Monitor frequency of use.  Asthma control goals:  Full participation in all desired activities (may need albuterol before activity) Albuterol use two times or less a week on average (not counting use with activity) Cough interfering with sleep two times or less a month Oral steroids no more than once a year No hospitalizations   Heartburn/reflux: Continue lifestyle and dietary modifications. Continue omeprazole daily. Nothing to eat/drink afterwards for 30 minutes.   Follow up in 3-4 months or sooner if needed.

## 2021-05-08 NOTE — Progress Notes (Signed)
? ?Follow Up Note ? ?RE: Andrew Ware MRN: [id number] DOB: [birth year] ?Date of Office Visit: 05/09/2021 ? ?Referring provider: Vivien Presto, MD ?Primary care provider: Corrington, Meredith Mody, MD ? ?Chief Complaint: Asthma (From nov to jan did a lot of improving he is still improving) ? ?History of Present Illness: ?I had the pleasure of seeing Andrew Ware for a [facility]. He is a 61 y.o. male, who is being followed for asthma, nonallergic rhinitis and GERD. His previous allergy office visit was on 02/07/2021 with Dr. Selena Batten. Today is a regular follow up visit. ? ?Asthma ?Currently on Airduo 1 puff twice a day and noticed about 10% decrease in symptoms since the last visit.  ?Sometimes he notices some coughing but lasting only a few minutes.  ? ?Stopped Singulair with no notable change in symptoms. ?Using albuterol once a week with good benefit. ? ?Denies any SOB, wheezing, chest tightness, nocturnal awakenings, ER/urgent care visits or prednisone use since the last visit. ? ?Nonallergic rhinitis ?Currently using Nasacort 1 spray per nostril BID and Atrovent 2 sprays per nostril BID. No nosebleeds. ? ?Gastroesophageal reflux disease ?Stable on omeprazole 40mg  daily most days.  ?When it flares takes tagamet. ? ?Assessment and Plan: ?Andrew Ware is a 61 y.o. male with: ?Asthma ?Past history - Patient had COVID-19 in January 2022 and May 2022.  Since then he noticed issues with coughing and not being able to take a deep enough breath.  Ace inhibitor stopped due to coughing. 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 Clinically feeling improved.  ?Interim history - noted about 10% worsening of symptoms since last visit and not sure why.  ?Today' spirometry showed some restriction. ?Daily controller medication(s): increase Airduo Digihaler to 2023 1 puff twice a day and rinse mouth  afterwards.  ?Let know if not covered.  ?May use albuterol rescue inhaler 2 puffs every 4 to 6 hours as needed for shortness of breath, chest tightness, coughing, and wheezing. Monitor frequency of use.  ?Get spirometry at next visit. ? ?Nonallergic rhinitis ?Past history - Perennial rhinoconjunctivitis symptoms for many years but worsening lately.  Tried Singulair, Zyrtec, Claritin, Flonase, Nasacort with some benefit. No prior sinus surgery.  Takes omeprazole for reflux. 2022 skin testing showed: Negative to indoor/outdoor allergens. ?Interim history - stable with below regimen. ?Use Flonase (fluticasone) or Nasacort nasal spray 1 spray per nostril twice a day as needed for nasal congestion.  ?Use Atrovent (ipratropium) 0.03% 1-2 sprays per nostril twice a day as needed for runny nose/drainage. ?Nasal saline spray (i.e., Simply Saline) or nasal saline lavage (i.e., NeilMed) is recommended as needed and prior to medicated nasal sprays. ?If no improvement then will refer to ENT next.  ? ?Gastroesophageal reflux disease ?Takes extra tagamet if needed.  ?Continue lifestyle and dietary modifications. ?Continue omeprazole daily and may increase to twice a day during flares. Nothing to eat/drink afterwards for 30 minutes.  ? ?Return in about 3 months (around 08/08/2021). ? ?Meds ordered this encounter  ?Medications  ? Fluticasone-Salmeterol,sensor, (AIRDUO [place]) [phone] MCG/ACT AEPB  ?  Sig: Inhale 1 puff into the lungs in the morning and at bedtime. Rinse mouth after each use.  ?  Dispense:  1 each  ?  Refill:  5  ?  Please dispense with saving card Group: [id number] BIN: [id number] PCN: [id number]  ? ?Lab Orders  ?No laboratory test(s) ordered  today  ? ? ?Diagnostics: ?Spirometry:  ?Tracings reviewed. His effort: Good reproducible efforts. ?FVC: 3.69L ?FEV1: 2.67L, 62% predicted ?FEV1/FVC ratio: 72% ?Interpretation: Spirometry consistent with possible restrictive disease.  ?Please see scanned spirometry results for  details. ? ?Medication List:  ?Current Outpatient Medications  ?Medication Sig Dispense Refill  ? albuterol (VENTOLIN HFA) 108 (90 Base) MCG/ACT inhaler Inhale 2 puffs into the lungs every 4 (four) hours as needed for wheezing or shortness of breath. 18 g 1  ? aspirin 81 MG EC tablet Take by mouth.    ? atorvastatin (LIPITOR) 10 MG tablet Take 10 mg by mouth daily.    ? Fluticasone-Salmeterol,sensor, (AIRDUO DIGIHALER) 232-14 MCG/ACT AEPB Inhale 1 puff into the lungs in the morning and at bedtime. Rinse mouth after each use. 1 each 5  ? ipratropium (ATROVENT) 0.03 % nasal spray Place 1-2 sprays into both nostrils 2 (two) times daily as needed (nasal drainage). 30 mL 5  ? Lutein 6 MG CAPS Take by mouth.    ? meloxicam (MOBIC) 15 MG tablet Take 15 mg by mouth as needed.    ? metoprolol (TOPROL-XL) 200 MG 24 hr tablet Take 1 tablet by mouth daily.    ? Multiple Vitamins-Minerals (MULTIVITAMIN & MINERAL PO) Take 1 tablet by mouth daily.    ? Omega-3 Fatty Acids (FISH OIL) 1000 MG CAPS Take 1 capsule by mouth daily.    ? omeprazole (PRILOSEC) 40 MG capsule Take 40 mg by mouth daily.    ? olmesartan (BENICAR) 20 MG tablet TAKE ONE TABLET BY MOUTH 2 TIMES DAILY.    ? ?No current facility-administered medications for this visit.  ? ?Allergies: ?No Known Allergies ?I reviewed his past medical history, social history, family history, and environmental history and no significant changes have been reported from his previous visit. ? ?Review of Systems  ?Constitutional:  Negative for appetite change, chills, fever and unexpected weight change.  ?HENT:  Negative for congestion and postnasal drip.   ?Eyes:  Negative for itching.  ?Respiratory:  Positive for cough. Negative for chest tightness, shortness of breath and wheezing.   ?Cardiovascular:  Negative for chest pain.  ?Gastrointestinal:  Negative for abdominal pain.  ?Genitourinary:  Negative for difficulty urinating.  ?Skin:  Negative for rash.  ?Allergic/Immunologic:  Negative for environmental allergies.  ?Neurological:  Negative for headaches.  ? ?Objective: ?BP 124/74   Pulse 60   Temp 98.1 ?F (36.7 ?C) (Temporal)   Resp 18   SpO2 98%  ?There is no height or weight on file to calculate BMI. ?Physical Exam ?Vitals and nursing note reviewed.  ?Constitutional:   ?   Appearance: Normal appearance. He is well-developed.  ?HENT:  ?   Head: Normocephalic and atraumatic.  ?   Right Ear: Tympanic membrane and external ear normal.  ?   Left Ear: Tympanic membrane and external ear normal.  ?   Nose: Nose normal.  ?   Mouth/Throat:  ?   Mouth: Mucous membranes are moist.  ?   Pharynx: Oropharynx is clear.  ?Eyes:  ?   Conjunctiva/sclera: Conjunctivae normal.  ?Cardiovascular:  ?   Rate and Rhythm: Normal rate and regular rhythm.  ?   Heart sounds: Normal heart sounds. No murmur heard. ?  No friction rub. No gallop.  ?Pulmonary:  ?   Effort: Pulmonary effort is normal.  ?   Breath sounds: Normal breath sounds. No wheezing, rhonchi or rales.  ?Musculoskeletal:  ?   Cervical back: Neck supple.  ?Skin: ?  General: Skin is warm.  ?   Findings: No rash.  ?Neurological:  ?   Mental Status: He is alert and oriented to person, place, and time.  ?Psychiatric:     ?   Behavior: Behavior normal.  ? ?Previous notes and tests were reviewed. ?The plan was reviewed with the patient/family, and all questions/concerned were addressed. ? ?It was my pleasure to see Andrew Ware today and participate in his care. Please feel free to contact me with any questions or concerns. ? ?Sincerely, ? ?Wyline Mood, DO ?Allergy & Immunology ? ?[facility] ?[place] office: [phone] ?[place] office: [phone] ?

## 2021-05-09 ENCOUNTER — Encounter: Payer: Self-pay | Admitting: Allergy

## 2021-05-09 ENCOUNTER — Ambulatory Visit (INDEPENDENT_AMBULATORY_CARE_PROVIDER_SITE_OTHER): Payer: BC Managed Care – PPO | Admitting: Allergy

## 2021-05-09 VITALS — BP 124/74 | HR 60 | Temp 98.1°F | Resp 18

## 2021-05-09 DIAGNOSIS — K219 Gastro-esophageal reflux disease without esophagitis: Secondary | ICD-10-CM

## 2021-05-09 DIAGNOSIS — J310 Chronic rhinitis: Secondary | ICD-10-CM | POA: Diagnosis not present

## 2021-05-09 DIAGNOSIS — J4540 Moderate persistent asthma, uncomplicated: Secondary | ICD-10-CM | POA: Diagnosis not present

## 2021-05-09 MED ORDER — AIRDUO DIGIHALER 232-14 MCG/ACT IN AEPB: 1.0000 | INHALATION_SPRAY | Freq: Two times a day (BID) | RESPIRATORY_TRACT | 5 refills | Status: DC

## 2021-05-09 NOTE — Assessment & Plan Note (Signed)
Past history - Perennial rhinoconjunctivitis symptoms for many years but worsening lately.  Tried Singulair, Zyrtec, Claritin, Flonase, Nasacort with some benefit. No prior sinus surgery.  Takes omeprazole for reflux. 2022 skin testing showed: Negative to indoor/outdoor allergens. ?Interim history - stable with below regimen. ?? Use Flonase (fluticasone) or Nasacort nasal spray 1 spray per nostril twice a day as needed for nasal congestion.  ?? Use Atrovent (ipratropium) 0.03% 1-2 sprays per nostril twice a day as needed for runny nose/drainage. ?? Nasal saline spray (i.e., Simply Saline) or nasal saline lavage (i.e., NeilMed) is recommended as needed and prior to medicated nasal sprays. ?? If no improvement then will refer to ENT next.  ?

## 2021-05-09 NOTE — Assessment & Plan Note (Signed)
Past history - Patient had COVID-19 in January 2022 and May 2022.  Since then he noticed issues with coughing and not being able to take a deep enough breath.  Ace inhibitor stopped due to coughing. 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 Clinically feeling improved.  ?Interim history - noted about 10% worsening of symptoms since last visit and not sure why.  ?? Today' spirometry showed some restriction. ?? Daily controller medication(s): increase Airduo Digihaler to 1 puff twice a day and rinse mouth afterwards.  ?o Let us know if not covered.  ?? May use albuterol rescue inhaler 2 puffs every 4 to 6 hours as needed for shortness of breath, chest tightness, coughing, and wheezing. Monitor frequency of use.  ?? Get spirometry at next visit. ?

## 2021-05-09 NOTE — Assessment & Plan Note (Signed)
Takes extra tagamet if needed.  ?? Continue lifestyle and dietary modifications. ?? Continue omeprazole daily and may increase to twice a day during flares. Nothing to eat/drink afterwards for 30 minutes.  ?

## 2021-05-09 NOTE — Patient Instructions (Addendum)
Rhinitis: ?Use Flonase (fluticasone) or Nasacort nasal spray 1 spray per nostril twice a day as needed for nasal congestion.  ?Use Atrovent (ipratropium) 0.03% 1-2 sprays per nostril twice a day as needed for runny nose/drainage. ?Nasal saline spray (i.e., Simply Saline) or nasal saline lavage (i.e., NeilMed) is recommended as needed and prior to medicated nasal sprays. ?If no improvement then will refer to ENT next.  ? ?Breathing: ?Daily controller medication(s): increase Airduo Digihaler to 1 puff twice a day and rinse mouth afterwards.  ?Let us know if not covered.  ?May use albuterol rescue inhaler 2 puffs every 4 to 6 hours as needed for shortness of breath, chest tightness, coughing, and wheezing. Monitor frequency of use.  ?Asthma control goals:  ?Full participation in all desired activities (may need albuterol before activity) ?Albuterol use two times or less a week on average (not counting use with activity) ?Cough interfering with sleep two times or less a month ?Oral steroids no more than once a year ?No hospitalizations  ? ?Heartburn/reflux: ?Continue lifestyle and dietary modifications. ?Continue omeprazole daily and may increase to twice a day during flares. Nothing to eat/drink afterwards for 30 minutes.  ? ?Follow up in 3-4 months or sooner if needed. ?

## 2021-05-30 ENCOUNTER — Other Ambulatory Visit: Payer: Self-pay

## 2021-05-30 MED ORDER — IPRATROPIUM BROMIDE 0.03 % NA SOLN: 1.0000 | Freq: Two times a day (BID) | NASAL | 5 refills | Status: DC | PRN

## 2021-07-17 NOTE — Progress Notes (Signed)
Follow Up Note  RE: Andrew Ware MRN: [id number] DOB: March 27, 1960 Date of Office Visit: 07/18/2021  Referring provider: Curly Rim, MD Primary care provider: Curly Rim, MD  Chief Complaint: Cough (Got Covid back in May and woke up struggling to breath. Then got a sinus infection. Did 10 days augmentin, 6 days predisone. Is having congestion and coughing. )  History of Present Illness: I had the pleasure of seeing Andrew Ware for a follow up visit at the [facility] on 07/18/2021. He is a 61 y.o. male, who is being followed for asthma, nonallergic rhinitis and GERD. His previous allergy office visit was on 05/09/2021 with Dr. Maudie Mercury. Today is a new complaint visit of breathing issues.  Asthma Patient had Covid-19 in May 2023 after going to a wedding. Symptoms improved after 5 days. Then he went on a trip which was about an 8 hour drive but the shortness of breath did not happen until coming back from the trip. Denies any swelling/pain in the legs.   He went to see PCP and was treated with Augmentin, steroid injection and oral prednisone.   Still having some shortness of breath, coughing, nasal congestion.  He is feeling better but not back to his pre-Covid breathing. Up to date with Covid-19 vaccinations.  Denies fever/chills.   Currently on Airduo generic 252mcg 1 puff BID and using albuterol sometimes up to 3-4 times per day for the past 1 month.  The digihaler no longer covered by his insurance.    Nonallergic rhinitis Using Atrovent 2 sprays BID and Flonase/Nasacort 1 spray BID. No nosebleeds. Using saline nasal spray due to dryness.    Gastroesophageal reflux disease Taking omeprazole at least once a day with good benefit.   Assessment and Plan: Andrew Ware is a 61 y.o. male with: Not well controlled moderate persistent asthma Past history - Patient had COVID-19 in January 2022 and May 2022.  Since then he noticed issues with coughing and not being able  to take a deep enough breath.  Ace inhibitor stopped due to coughing. 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 Clinically feeling improved.  Interim history - got Covid-19 again in May 2023 and since then breathing is not back to baseline. Had IM steroid and oral prednisone.  Today' spirometry showed some restriction with some improvement in FEV1 post bronchodilator treatment. Clinically feeling better.  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  Daily controller medication(s): START Trelegy 24mcg 1 puff once a day tomorrow for 2 months. Samples given. If not covered let us know.  This replaces Airduo. If not feeling better after 1 month then get chest X-ray. Take albuterol 2 puffs prior to using Trelegy for the next 1 week. Start prednisone taper. Prednisone 10mg  tablets - take 2 tablets for 4 days then 1 tablet on day 5.  May use albuterol rescue inhaler 2 puffs every 4 to 6 hours as needed for shortness of breath, chest tightness, coughing, and wheezing. Monitor frequency of use.  Get spirometry at next visit.  Nonallergic rhinitis Past history - Perennial rhinoconjunctivitis symptoms for many years but worsening lately.  Tried Singulair, Zyrtec, Claritin, Flonase, Nasacort with some benefit. No prior sinus surgery.  Takes omeprazole for reflux. 2022 skin testing showed: Negative to indoor/outdoor allergens. Interim history - some dryness. Use Flonase (fluticasone) or Nasacort nasal spray 1 spray per nostril twice a day as needed for nasal congestion.  Use Atrovent (  ipratropium) 0.03% 1 sprays per nostril twice a day as needed for runny nose/drainage. Nasal saline spray (i.e., Simply Saline) or nasal saline lavage (i.e., NeilMed) is recommended as needed and prior to medicated nasal sprays. If no improvement then will refer to ENT next.   Gastroesophageal reflux disease Stable.  Continue lifestyle  and dietary modifications. Continue omeprazole daily and may increase to twice a day during flares. Nothing to eat/drink afterwards for 30 minutes.   Return in about 2 months (around 09/17/2021).  Meds ordered this encounter  Medications   Fluticasone-Umeclidin-Vilant (TRELEGY ELLIPTA) 200-62.5-25 MCG/ACT AEPB    Sig: Inhale 1 puff into the lungs daily. Rinse mouth after each use.    Dispense:  60 each    Refill:  3   albuterol (VENTOLIN HFA) 108 (90 Base) MCG/ACT inhaler    Sig: Inhale 2 puffs into the lungs every 4 (four) hours as needed for wheezing or shortness of breath.    Dispense:  18 g    Refill:  1   ipratropium (ATROVENT) 0.03 % nasal spray    Sig: Place 1 spray into both nostrils 2 (two) times daily as needed (nasal drainage).    Dispense:  30 mL    Refill:  5   Lab Orders  No laboratory test(s) ordered today    Diagnostics: Spirometry:  Tracings reviewed. His effort: It was hard to get consistent efforts and there is a question as to whether this reflects a maximal maneuver. FVC: 3.42L FEV1: 2.56L, 70% predicted FEV1/FVC ratio: 75% Interpretation: Spirometry consistent with possible restrictive disease with some improvement in FEV1 post bronchodilator treatment. Clinically feeling improved.   Please see scanned spirometry results for details.  Medication List:  Current Outpatient Medications  Medication Sig Dispense Refill   amLODipine (NORVASC) 5 MG tablet Take 5 mg by mouth daily.     aspirin 81 MG EC tablet Take by mouth.     atorvastatin (LIPITOR) 10 MG tablet Take 10 mg by mouth daily.     benzonatate (TESSALON) 100 MG capsule Take 100 mg by mouth as needed.     fluticasone (FLONASE) 50 MCG/ACT nasal spray one spray by Both Nostrils route daily.     Fluticasone-Salmeterol,sensor, (AIRDUO DIGIHALER) 232-14 MCG/ACT AEPB Inhale 1 puff into the lungs in the morning and at bedtime. Rinse mouth after each use. 1 each 5   Fluticasone-Umeclidin-Vilant (TRELEGY  ELLIPTA) 200-62.5-25 MCG/ACT AEPB Inhale 1 puff into the lungs daily. Rinse mouth after each use. 60 each 3   irbesartan (AVAPRO) 150 MG tablet Take 150 mg by mouth 2 (two) times daily.     loratadine (CLARITIN) 10 MG tablet Take by mouth.     Lutein 6 MG CAPS Take by mouth.     meloxicam (MOBIC) 15 MG tablet Take 15 mg by mouth as needed.     meloxicam (MOBIC) 15 MG tablet Take by mouth.     metoprolol (TOPROL-XL) 200 MG 24 hr tablet Take 1 tablet by mouth daily.     Multiple Vitamins-Minerals (MULTIVITAMIN & MINERAL PO) Take 1 tablet by mouth daily.     Omega-3 Fatty Acids (FISH OIL) 1000 MG CAPS Take 1 capsule by mouth daily.     omeprazole (PRILOSEC) 40 MG capsule Take 40 mg by mouth daily.     triamcinolone (NASACORT) 55 MCG/ACT AERO nasal inhaler two sprays by Both Nostrils route daily.     albuterol (VENTOLIN HFA) 108 (90 Base) MCG/ACT inhaler Inhale 2 puffs into the lungs every 4 (  four) hours as needed for wheezing or shortness of breath. 18 g 1   ipratropium (ATROVENT) 0.03 % nasal spray Place 1 spray into both nostrils 2 (two) times daily as needed (nasal drainage). 30 mL 5   No current facility-administered medications for this visit.   Allergies: No Known Allergies I reviewed his past medical history, social history, family history, and environmental history and no significant changes have been reported from his previous visit.  Review of Systems  Constitutional:  Negative for appetite change, chills, fever and unexpected weight change.  HENT:  Positive for congestion. Negative for postnasal drip.   Eyes:  Negative for itching.  Respiratory:  Positive for cough and shortness of breath. Negative for chest tightness and wheezing.   Cardiovascular:  Negative for chest pain.  Gastrointestinal:  Negative for abdominal pain.  Genitourinary:  Negative for difficulty urinating.  Skin:  Negative for rash.  Allergic/Immunologic: Negative for environmental allergies.  Neurological:   Negative for headaches.    Objective: BP 128/84   Pulse 78   Temp 97.9 F (36.6 C)   Ht 5' 8.75" (1.746 m)   Wt 203 lb 12 oz (92.4 kg)   SpO2 96%   BMI 30.31 kg/m  Body mass index is 30.31 kg/m. Physical Exam Vitals and nursing note reviewed.  Constitutional:      Appearance: Normal appearance. He is well-developed.  HENT:     Head: Normocephalic and atraumatic.     Right Ear: Tympanic membrane and external ear normal.     Left Ear: Tympanic membrane and external ear normal.     Nose: Nose normal.     Mouth/Throat:     Mouth: Mucous membranes are moist.     Pharynx: Oropharynx is clear.  Eyes:     Conjunctiva/sclera: Conjunctivae normal.  Cardiovascular:     Rate and Rhythm: Normal rate and regular rhythm.     Heart sounds: Normal heart sounds. No murmur heard.    No friction rub. No gallop.  Pulmonary:     Effort: Pulmonary effort is normal.     Breath sounds: Normal breath sounds. No wheezing, rhonchi or rales.  Musculoskeletal:     Cervical back: Neck supple.  Skin:    General: Skin is warm.     Findings: No rash.  Neurological:     Mental Status: He is alert and oriented to person, place, and time.  Psychiatric:        Behavior: Behavior normal.    Previous notes and tests were reviewed. The plan was reviewed with the patient/family, and all questions/concerned were addressed.  It was my pleasure to see Andrew Ware today and participate in his care. Please feel free to contact me with any questions or concerns.  Sincerely,  Rexene Alberts, DO Allergy & Immunology  [facility] office: [place] office: [phone]

## 2021-07-18 ENCOUNTER — Ambulatory Visit (INDEPENDENT_AMBULATORY_CARE_PROVIDER_SITE_OTHER): Payer: BC Managed Care – PPO | Admitting: Allergy

## 2021-07-18 ENCOUNTER — Encounter: Payer: Self-pay | Admitting: Allergy

## 2021-07-18 VITALS — BP 128/84 | HR 78 | Temp 97.9°F | Ht 68.75 in | Wt 203.8 lb

## 2021-07-18 DIAGNOSIS — K219 Gastro-esophageal reflux disease without esophagitis: Secondary | ICD-10-CM

## 2021-07-18 DIAGNOSIS — J310 Chronic rhinitis: Secondary | ICD-10-CM

## 2021-07-18 DIAGNOSIS — J4540 Moderate persistent asthma, uncomplicated: Secondary | ICD-10-CM | POA: Diagnosis not present

## 2021-07-18 DIAGNOSIS — R058 Other specified cough: Secondary | ICD-10-CM

## 2021-07-18 MED ORDER — TRELEGY ELLIPTA 200-62.5-25 MCG/ACT IN AEPB: 1.0000 | INHALATION_SPRAY | Freq: Every day | RESPIRATORY_TRACT | 3 refills | Status: DC

## 2021-07-18 MED ORDER — ALBUTEROL SULFATE HFA 108 (90 BASE) MCG/ACT IN AERS: 2.0000 | INHALATION_SPRAY | RESPIRATORY_TRACT | 1 refills | Status: DC | PRN

## 2021-07-18 MED ORDER — IPRATROPIUM BROMIDE 0.03 % NA SOLN: 1.0000 | Freq: Two times a day (BID) | NASAL | 5 refills | Status: DC | PRN

## 2021-07-18 NOTE — Patient Instructions (Addendum)
Breathing: Daily controller medication(s): START Trelegy 1 puff once a day tomorrow for 2 months. Samples given. If not covered let us know.  This replaces Airduo. If not feeling better after 1 month then get chest X-ray. Take albuterol 2 puffs prior to using Trelegy for the next 1 week. Start prednisone taper. Prednisone 10mg  tablets - take 2 tablets for 4 days then 1 tablet on day 5.   May use albuterol rescue inhaler 2 puffs every 4 to 6 hours as needed for shortness of breath, chest tightness, coughing, and wheezing. Monitor frequency of use.  Asthma control goals:  Full participation in all desired activities (may need albuterol before activity) Albuterol use two times or less a week on average (not counting use with activity) Cough interfering with sleep two times or less a month Oral steroids no more than once a year No hospitalizations   Rhinitis: Use Flonase (fluticasone) or Nasacort nasal spray 1 spray per nostril twice a day as needed for nasal congestion.  Use Atrovent (ipratropium) 0.03% 1 sprays per nostril twice a day as needed for runny nose/drainage. Nasal saline spray (i.e., Simply Saline) or nasal saline lavage (i.e., NeilMed) is recommended as needed and prior to medicated nasal sprays. If no improvement then will refer to ENT next.   Heartburn/reflux: Continue lifestyle and dietary modifications. Continue omeprazole daily and may increase to twice a day during flares. Nothing to eat/drink afterwards for 30 minutes.   Follow up in 2 months or sooner if needed.

## 2021-07-18 NOTE — Assessment & Plan Note (Signed)
Past history - Patient had COVID-19 in January 2022 and May 2022.  Since then he noticed issues with coughing and not being able to take a deep enough breath.  Ace inhibitor stopped due to coughing. 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 Clinically feeling improved.  Interim history - got Covid-19 again in May 2023 and since then breathing is not back to baseline. Had IM steroid and oral prednisone.   Today' spirometry showed some restriction with some improvement in FEV1 post bronchodilator treatment. Clinically feeling better.   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  . Daily controller medication(s): START Trelegy 1 puff once a day tomorrow for 2 months. Samples given. If not covered let us know.  o This replaces Airduo. o If not feeling better after 1 month then get chest X-ray. . Take albuterol 2 puffs prior to using Trelegy for the next 1 week. . Start prednisone taper. Prednisone 10mg  tablets - take 2 tablets for 4 days then 1 tablet on day 5.  . May use albuterol rescue inhaler 2 puffs every 4 to 6 hours as needed for shortness of breath, chest tightness, coughing, and wheezing. Monitor frequency of use.   Get spirometry at next visit.

## 2021-07-18 NOTE — Assessment & Plan Note (Signed)
Past history - Perennial rhinoconjunctivitis symptoms for many years but worsening lately.  Tried Singulair, Zyrtec, Claritin, Flonase, Nasacort with some benefit. No prior sinus surgery.  Takes omeprazole for reflux. 2022 skin testing showed: Negative to indoor/outdoor allergens. Interim history - some dryness. . Use Flonase (fluticasone) or Nasacort nasal spray 1 spray per nostril twice a day as needed for nasal congestion.   Use Atrovent (ipratropium) 0.03% 1 sprays per nostril twice a day as needed for runny nose/drainage.  Nasal saline spray (i.e., Simply Saline) or nasal saline lavage (i.e., NeilMed) is recommended as needed and prior to medicated nasal sprays.  If no improvement then will refer to ENT next.

## 2021-07-18 NOTE — Assessment & Plan Note (Signed)
Stable.   Continue lifestyle and dietary modifications.  Continue omeprazole daily and may increase to twice a day during flares. Nothing to eat/drink afterwards for 30 minutes.

## 2021-07-29 ENCOUNTER — Ambulatory Visit
Admission: RE | Admit: 2021-07-29 | Discharge: 2021-07-29 | Disposition: A | Discharge status: Home / Self Care | Payer: BC Managed Care – PPO | Source: Ambulatory Visit | Attending: Allergy | Admitting: Allergy

## 2021-07-29 DIAGNOSIS — J4540 Moderate persistent asthma, uncomplicated: Secondary | ICD-10-CM

## 2021-07-29 DIAGNOSIS — R058 Other specified cough: Secondary | ICD-10-CM

## 2021-08-19 NOTE — Progress Notes (Unsigned)
Follow Up Note  RE: Andrew Ware MRN: [id number] DOB: 06/11/1960 Date of Office Visit: 08/20/2021  Referring provider: Vivien Presto, MD Primary care provider: Vivien Presto, MD  Chief Complaint: No chief complaint on file.  History of Present Illness: I had the pleasure of seeing Andrew Ware for a follow up visit at the [facility] on 08/19/2021. He is a 61 y.o. male, who is being followed for asthma, nonallergic rhinitis and GERD. His previous allergy office visit was on 07/18/2021 with Dr. Selena Batten. Today is a regular follow up visit.  Not well controlled moderate persistent asthma Past history - Patient had COVID-19 in January 2022 and May 2022.  Since then he noticed issues with coughing and not being able to take a deep enough breath.  Ace inhibitor stopped due to coughing. 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 Clinically feeling improved.  Interim history - got Covid-19 again in May 2023 and since then breathing is not back to baseline. Had IM steroid and oral prednisone.  Today' spirometry showed some restriction with some improvement in FEV1 post bronchodilator treatment. Clinically feeling better.  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  Daily controller medication(s): START Trelegy 1 puff once a day tomorrow for 2 months. Samples given. If not covered let us know.  This replaces Airduo. If not feeling better after 1 month then get chest X-ray. Take albuterol 2 puffs prior to using Trelegy for the next 1 week. Start prednisone taper. Prednisone 10mg  tablets - take 2 tablets for 4 days then 1 tablet on day 5.  May use albuterol rescue inhaler 2 puffs every 4 to 6 hours as needed for shortness of breath, chest tightness, coughing, and wheezing. Monitor frequency of use.  Get spirometry at next visit.   Nonallergic rhinitis Past history - Perennial  rhinoconjunctivitis symptoms for many years but worsening lately.  Tried Singulair, Zyrtec, Claritin, Flonase, Nasacort with some benefit. No prior sinus surgery.  Takes omeprazole for reflux. 2022 skin testing showed: Negative to indoor/outdoor allergens. Interim history - some dryness. Use Flonase (fluticasone) or Nasacort nasal spray 1 spray per nostril twice a day as needed for nasal congestion.  Use Atrovent (ipratropium) 0.03% 1 sprays per nostril twice a day as needed for runny nose/drainage. Nasal saline spray (i.e., Simply Saline) or nasal saline lavage (i.e., NeilMed) is recommended as needed and prior to medicated nasal sprays. If no improvement then will refer to ENT next.    Gastroesophageal reflux disease Stable.  Continue lifestyle and dietary modifications. Continue omeprazole daily and may increase to twice a day during flares. Nothing to eat/drink afterwards for 30 minutes.    Return in about 2 months (around 09/17/2021).  Assessment and Plan: Andrew Ware is a 61 y.o. male with: No problem-specific Assessment & Plan notes found for this encounter.  No follow-ups on file.  No orders of the defined types were placed in this encounter.  Lab Orders  No laboratory test(s) ordered today    Diagnostics: Spirometry:  Tracings reviewed. His effort: {Blank single:19197::"Good reproducible efforts.","It was hard to get consistent efforts and there is a question as to whether this reflects a maximal maneuver.","Poor effort, data can not be interpreted."} FVC: ***L FEV1: ***L, ***% predicted FEV1/FVC ratio: ***% Interpretation: {Blank single:19197::"Spirometry consistent with mild obstructive disease","Spirometry consistent with moderate obstructive disease","Spirometry consistent with severe obstructive disease","Spirometry consistent with possible restrictive disease","Spirometry consistent with mixed obstructive and restrictive  disease","Spirometry uninterpretable due to  technique","Spirometry consistent with normal pattern","No overt abnormalities noted given today's efforts"}.  Please see scanned spirometry results for details.  Skin Testing: {Blank single:19197::"Select foods","Environmental allergy panel","Environmental allergy panel and select foods","Food allergy panel","None","Deferred due to recent antihistamines use"}. *** Results discussed with patient/family.   Medication List:  Current Outpatient Medications  Medication Sig Dispense Refill  . albuterol (VENTOLIN HFA) 108 (90 Base) MCG/ACT inhaler Inhale 2 puffs into the lungs every 4 (four) hours as needed for wheezing or shortness of breath. 18 g 1  . amLODipine (NORVASC) 5 MG tablet Take 5 mg by mouth daily.    Marland Kitchen aspirin 81 MG EC tablet Take by mouth.    Marland Kitchen atorvastatin (LIPITOR) 10 MG tablet Take 10 mg by mouth daily.    . benzonatate (TESSALON) 100 MG capsule Take 100 mg by mouth as needed.    . fluticasone (FLONASE) 50 MCG/ACT nasal spray one spray by Both Nostrils route daily.    . Fluticasone-Salmeterol,sensor, (AIRDUO DIGIHALER) 232-14 MCG/ACT AEPB Inhale 1 puff into the lungs in the morning and at bedtime. Rinse mouth after each use. 1 each 5  . Fluticasone-Umeclidin-Vilant (TRELEGY ELLIPTA) 200-62.5-25 MCG/ACT AEPB Inhale 1 puff into the lungs daily. Rinse mouth after each use. 60 each 3  . ipratropium (ATROVENT) 0.03 % nasal spray Place 1 spray into both nostrils 2 (two) times daily as needed (nasal drainage). 30 mL 5  . irbesartan (AVAPRO) 150 MG tablet Take 150 mg by mouth 2 (two) times daily.    Marland Kitchen loratadine (CLARITIN) 10 MG tablet Take by mouth.    . Lutein 6 MG CAPS Take by mouth.    . meloxicam (MOBIC) 15 MG tablet Take 15 mg by mouth as needed.    . meloxicam (MOBIC) 15 MG tablet Take by mouth.    . metoprolol (TOPROL-XL) 200 MG 24 hr tablet Take 1 tablet by mouth daily.    . Multiple Vitamins-Minerals (MULTIVITAMIN & MINERAL PO) Take 1 tablet by mouth daily.    . Omega-3 Fatty  Acids (FISH OIL) 1000 MG CAPS Take 1 capsule by mouth daily.    Marland Kitchen omeprazole (PRILOSEC) 40 MG capsule Take 40 mg by mouth daily.    Marland Kitchen triamcinolone (NASACORT) 55 MCG/ACT AERO nasal inhaler two sprays by Both Nostrils route daily.     No current facility-administered medications for this visit.   Allergies: No Known Allergies I reviewed his past medical history, social history, family history, and environmental history and no significant changes have been reported from his previous visit.  Review of Systems  Constitutional:  Negative for appetite change, chills, fever and unexpected weight change.  HENT:  Positive for congestion. Negative for postnasal drip.   Eyes:  Negative for itching.  Respiratory:  Positive for cough and shortness of breath. Negative for chest tightness and wheezing.   Cardiovascular:  Negative for chest pain.  Gastrointestinal:  Negative for abdominal pain.  Genitourinary:  Negative for difficulty urinating.  Skin:  Negative for rash.  Allergic/Immunologic: Negative for environmental allergies.  Neurological:  Negative for headaches.   Objective: There were no vitals taken for this visit. There is no height or weight on file to calculate BMI. Physical Exam Vitals and nursing note reviewed.  Constitutional:      Appearance: Normal appearance. He is well-developed.  HENT:     Head: Normocephalic and atraumatic.     Right Ear: Tympanic membrane and external ear normal.     Left Ear: Tympanic membrane and  external ear normal.     Nose: Nose normal.     Mouth/Throat:     Mouth: Mucous membranes are moist.     Pharynx: Oropharynx is clear.  Eyes:     Conjunctiva/sclera: Conjunctivae normal.  Cardiovascular:     Rate and Rhythm: Normal rate and regular rhythm.     Heart sounds: Normal heart sounds. No murmur heard.    No friction rub. No gallop.  Pulmonary:     Effort: Pulmonary effort is normal.     Breath sounds: Normal breath sounds. No wheezing, rhonchi  or rales.  Musculoskeletal:     Cervical back: Neck supple.  Skin:    General: Skin is warm.     Findings: No rash.  Neurological:     Mental Status: He is alert and oriented to person, place, and time.  Psychiatric:        Behavior: Behavior normal.  Previous notes and tests were reviewed. The plan was reviewed with the patient/family, and all questions/concerned were addressed.  It was my pleasure to see Cesar today and participate in his care. Please feel free to contact me with any questions or concerns.  Sincerely,  Wyline Mood, DO Allergy & Immunology  [facility] office: [phone] [facility] office: [phone]

## 2021-08-20 ENCOUNTER — Encounter: Payer: Self-pay | Admitting: Allergy

## 2021-08-20 ENCOUNTER — Ambulatory Visit (INDEPENDENT_AMBULATORY_CARE_PROVIDER_SITE_OTHER): Payer: BC Managed Care – PPO | Admitting: Allergy

## 2021-08-20 VITALS — BP 122/84 | HR 83 | Temp 98.3°F | Resp 18

## 2021-08-20 DIAGNOSIS — K219 Gastro-esophageal reflux disease without esophagitis: Secondary | ICD-10-CM | POA: Diagnosis not present

## 2021-08-20 DIAGNOSIS — J4540 Moderate persistent asthma, uncomplicated: Secondary | ICD-10-CM

## 2021-08-20 DIAGNOSIS — J310 Chronic rhinitis: Secondary | ICD-10-CM | POA: Diagnosis not present

## 2021-08-20 NOTE — Assessment & Plan Note (Signed)
Past history - Patient had COVID-19 in January 2022,  May 2022, May 2023.  Since then he noticed issues with coughing and not being able to take a deep enough breath.  Ace inhibitor stopped due to coughing. 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 Clinically feeling improved.  Interim history - doing much better.   Today' spirometry showed some restriction - slight improvement from previous one. . Stop pre-treating with albuterol. . Daily controller medication(s): continue Trelegy 1 puff once a day and rinse mouth afterwards.  . May use albuterol rescue inhaler 2 puffs every 4 to 6 hours as needed for shortness of breath, chest tightness, coughing, and wheezing. Monitor frequency of use.   Get spirometry at next visit.

## 2021-08-20 NOTE — Assessment & Plan Note (Signed)
Past history - Perennial rhinoconjunctivitis symptoms for many years but worsening lately.  Tried Singulair, Zyrtec, Claritin, Flonase, Nasacort with some benefit. No prior sinus surgery.  Takes omeprazole for reflux. 2022 skin testing showed: Negative to indoor/outdoor allergens. Interim history - well controlled. . Use Flonase (fluticasone) or Nasacort nasal spray 1 spray per nostril twice a day as needed for nasal congestion.   Use Atrovent (ipratropium) 0.03% 1 spray per nostril twice a day AS NEEDED ONLY for runny nose/drainage.  Nasal saline spray (i.e., Simply Saline) or nasal saline lavage (i.e., NeilMed) is recommended as needed and prior to medicated nasal sprays.  If no improvement then will refer to ENT next.

## 2021-08-20 NOTE — Patient Instructions (Addendum)
Breathing: Stop pre-treating with albuterol. Daily controller medication(s): continue Trelegy 1 puff once a day and rinse mouth afterwards.  May use albuterol rescue inhaler 2 puffs every 4 to 6 hours as needed for shortness of breath, chest tightness, coughing, and wheezing. Monitor frequency of use.  Asthma control goals:  Full participation in all desired activities (may need albuterol before activity) Albuterol use two times or less a week on average (not counting use with activity) Cough interfering with sleep two times or less a month Oral steroids no more than once a year No hospitalizations   Rhinitis: Use Flonase (fluticasone) or Nasacort nasal spray 1 spray per nostril twice a day as needed for nasal congestion.  Use Atrovent (ipratropium) 0.03% 1 spray per nostril twice a day AS NEEDED ONLY for runny nose/drainage. Nasal saline spray (i.e., Simply Saline) or nasal saline lavage (i.e., NeilMed) is recommended as needed and prior to medicated nasal sprays. If no improvement then will refer to ENT next.   Heartburn/reflux: Continue lifestyle and dietary modifications. Continue omeprazole 40mg  daily and may increase to twice a day during flares. Nothing to eat/drink afterwards for 30 minutes.   Follow up in 4 months or sooner if needed.

## 2021-08-20 NOTE — Assessment & Plan Note (Signed)
Better with omeprazole 40mg  BID dose during flares. Also takes Gaviscon at night.   Continue lifestyle and dietary modifications.  Continue omeprazole 40mg  daily and may increase to twice a day during flares. Nothing to eat/drink afterwards for 30 minutes.   Okay to take Gaviscon as needed.  Consider repeat EGD.

## 2021-10-03 ENCOUNTER — Ambulatory Visit: Payer: BC Managed Care – PPO | Admitting: Allergy

## 2021-11-19 ENCOUNTER — Other Ambulatory Visit: Payer: Self-pay | Admitting: Allergy

## 2021-12-23 NOTE — Progress Notes (Unsigned)
Follow Up Note  RE: Andrew Ware MRN: [id number] DOB: 14-Apr-1960 Date of Office Visit: 12/24/2021  Referring provider: Curly Rim, MD Primary care provider: Curly Rim, MD  Chief Complaint: No chief complaint on file.  History of Present Illness: I had the pleasure of seeing Andrew Ware for a follow up visit at the [facility] [place] on 12/23/2021. He is a 61 y.o. male, who is being followed for asthma, nonallergic rhinitis, GERD. His previous allergy office visit was on 08/20/2021 with Dr. Maudie Mercury. Today is a regular follow up visit.  Asthma Past history - Patient had COVID-19 in January 2022,  May 2022, May 2023.  Since then he noticed issues with coughing and not being able to take a deep enough breath.  Ace inhibitor stopped due to coughing. 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 Clinically feeling improved.  Interim history - doing much better.  Today' spirometry showed some restriction - slight improvement from previous one. Stop pre-treating with albuterol. Daily controller medication(s): continue Trelegy 261mcg 1 puff once a day and rinse mouth afterwards.  May use albuterol rescue inhaler 2 puffs every 4 to 6 hours as needed for shortness of breath, chest tightness, coughing, and wheezing. Monitor frequency of use.  Get spirometry at next visit.   Nonallergic rhinitis Past history - Perennial rhinoconjunctivitis symptoms for many years but worsening lately.  Tried Singulair, Zyrtec, Claritin, Flonase, Nasacort with some benefit. No prior sinus surgery.  Takes omeprazole for reflux. 2022 skin testing showed: Negative to indoor/outdoor allergens. Interim history - well controlled. Use Flonase (fluticasone) or Nasacort nasal spray 1 spray per nostril twice a day as needed for nasal congestion.  Use Atrovent (ipratropium) 0.03% 1 spray per nostril twice a day AS NEEDED ONLY  for runny nose/drainage. Nasal saline spray (i.e., Simply Saline) or nasal saline lavage (i.e., NeilMed) is recommended as needed and prior to medicated nasal sprays. If no improvement then will refer to ENT next.    Gastroesophageal reflux disease Better with omeprazole 40mg  BID dose during flares. Also takes Gaviscon at night.  Continue lifestyle and dietary modifications. Continue omeprazole 40mg  daily and may increase to twice a day during flares. Nothing to eat/drink afterwards for 30 minutes.  Okay to take Gaviscon as needed. Consider repeat EGD.  11/12/2021 cardiology visit: "1. Andrew Ware is doing well from a cardiac standpoint. He feels quite well. He is active and exercising on a regular basis. His blood pressure is markedly improved control on his current regimen of irbesartan 150 mg twice daily, Toprol-XL 200 mg once daily, amlodipine 5 mg daily. He is happy with his blood pressure control. Is happy the way he feels. He feels we finally had on the right regimen. 2. We will continue with this current cardiac medical regimen. 3. He will continue on atorvastatin 10 mg daily. 4. He will return for routine follow-up in about 6 months time. 5. We will plan update an echocardiogram within the next 6 to 12 months. We had previously talked about changing [facility] to [facility] if his left-ventricular function demonstrated any future decline. As he is doing well with no heart failure symptoms and appears euvolemic and is active without any symptoms we will continue with his current medications as his hemodynamics are excellent."  Assessment and Plan: Andrew Ware is a 61 y.o. male with: No problem-specific Assessment & Plan notes found for this encounter.  No follow-ups on file.  No orders of the defined types were placed in this encounter.  Lab Orders  No laboratory test(s) ordered today    Diagnostics: Spirometry:  Tracings reviewed. His effort: {Blank single:19197::"Good reproducible  efforts.","It was hard to get consistent efforts and there is a question as to whether this reflects a maximal maneuver.","Poor effort, data can not be interpreted."} FVC: ***L FEV1: ***L, ***% predicted FEV1/FVC ratio: ***% Interpretation: {Blank single:19197::"Spirometry consistent with mild obstructive disease","Spirometry consistent with moderate obstructive disease","Spirometry consistent with severe obstructive disease","Spirometry consistent with possible restrictive disease","Spirometry consistent with mixed obstructive and restrictive disease","Spirometry uninterpretable due to technique","Spirometry consistent with normal pattern","No overt abnormalities noted given today's efforts"}.  Please see scanned spirometry results for details.  Skin Testing: {Blank single:19197::"Select foods","Environmental allergy panel","Environmental allergy panel and select foods","Food allergy panel","None","Deferred due to recent antihistamines use"}. *** Results discussed with patient/family.   Medication List:  Current Outpatient Medications  Medication Sig Dispense Refill   albuterol (VENTOLIN HFA) 108 (90 Base) MCG/ACT inhaler Inhale 2 puffs into the lungs every 4 (four) hours as needed for wheezing or shortness of breath. 18 g 1   amLODipine (NORVASC) 5 MG tablet Take 5 mg by mouth daily.     aspirin 81 MG EC tablet Take by mouth.     atorvastatin (LIPITOR) 10 MG tablet Take 10 mg by mouth daily.     benzonatate (TESSALON) 100 MG capsule Take 100 mg by mouth as needed.     fluticasone (FLONASE) 50 MCG/ACT nasal spray one spray by Both Nostrils route daily.     ipratropium (ATROVENT) 0.03 % nasal spray Place 1 spray into both nostrils 2 (two) times daily as needed (nasal drainage). 30 mL 5   irbesartan (AVAPRO) 150 MG tablet Take 150 mg by mouth 2 (two) times daily.     loratadine (CLARITIN) 10 MG tablet Take by mouth.     Lutein 6 MG CAPS Take by mouth.     meloxicam (MOBIC) 15 MG tablet Take  by mouth.     metoprolol (TOPROL-XL) 200 MG 24 hr tablet Take 1 tablet by mouth daily.     Multiple Vitamins-Minerals (MULTIVITAMIN & MINERAL PO) Take 1 tablet by mouth daily.     Omega-3 Fatty Acids (FISH OIL) 1000 MG CAPS Take 1 capsule by mouth daily.     omeprazole (PRILOSEC) 40 MG capsule Take 40 mg by mouth daily.     TRELEGY ELLIPTA 200-62.5-25 MCG/ACT AEPB INHALE 1 PUFF INTO THE LUNGS DAILY. RINSE MOUTH AFTER USE 60 each 5   triamcinolone (NASACORT) 55 MCG/ACT AERO nasal inhaler two sprays by Both Nostrils route daily.     No current facility-administered medications for this visit.   Allergies: No Known Allergies I reviewed his past medical history, social history, family history, and environmental history and no significant changes have been reported from his previous visit.  Review of Systems  Constitutional:  Negative for appetite change, chills, fever and unexpected weight change.  HENT:  Negative for congestion and postnasal drip.   Eyes:  Negative for itching.  Respiratory:  Negative for cough, chest tightness, shortness of breath and wheezing.   Cardiovascular:  Negative for chest pain.  Gastrointestinal:  Negative for abdominal pain.  Genitourinary:  Negative for difficulty urinating.  Skin:  Negative for rash.  Allergic/Immunologic: Negative for environmental allergies.  Neurological:  Negative for headaches.    Objective: There were no vitals taken for this visit. There is no height or weight on file to calculate BMI. Physical Exam Vitals  and nursing note reviewed.  Constitutional:      Appearance: Normal appearance. He is well-developed.  HENT:     Head: Normocephalic and atraumatic.     Right Ear: Tympanic membrane and external ear normal.     Left Ear: Tympanic membrane and external ear normal.     Nose: Nose normal.     Mouth/Throat:     Mouth: Mucous membranes are moist.     Pharynx: Oropharynx is clear.  Eyes:     Conjunctiva/sclera: Conjunctivae  normal.  Cardiovascular:     Rate and Rhythm: Normal rate and regular rhythm.     Heart sounds: Normal heart sounds. No murmur heard.    No friction rub. No gallop.  Pulmonary:     Effort: Pulmonary effort is normal.     Breath sounds: Normal breath sounds. No wheezing, rhonchi or rales.  Musculoskeletal:     Cervical back: Neck supple.  Skin:    General: Skin is warm.     Findings: No rash.  Neurological:     Mental Status: He is alert and oriented to person, place, and time.  Psychiatric:        Behavior: Behavior normal.    Previous notes and tests were reviewed. The plan was reviewed with the patient/family, and all questions/concerned were addressed.  It was my pleasure to see Andrew Ware today and participate in his care. Please feel free to contact me with any questions or concerns.  Sincerely,  Wyline Mood, DO Allergy & Immunology  [facility] office: [phone] [facility] office: [phone]

## 2021-12-24 ENCOUNTER — Encounter: Payer: Self-pay | Admitting: Allergy

## 2021-12-24 ENCOUNTER — Telehealth: Payer: Self-pay

## 2021-12-24 ENCOUNTER — Ambulatory Visit (INDEPENDENT_AMBULATORY_CARE_PROVIDER_SITE_OTHER): Payer: BC Managed Care – PPO | Admitting: Allergy

## 2021-12-24 VITALS — BP 128/82 | HR 82 | Temp 97.9°F | Resp 18

## 2021-12-24 DIAGNOSIS — J4540 Moderate persistent asthma, uncomplicated: Secondary | ICD-10-CM | POA: Diagnosis not present

## 2021-12-24 DIAGNOSIS — K219 Gastro-esophageal reflux disease without esophagitis: Secondary | ICD-10-CM | POA: Diagnosis not present

## 2021-12-24 DIAGNOSIS — J310 Chronic rhinitis: Secondary | ICD-10-CM | POA: Diagnosis not present

## 2021-12-24 NOTE — Assessment & Plan Note (Signed)
Stable with omeprazole 40mg  and Gaviscon prn.  Continue lifestyle and dietary modifications. Continue omeprazole 40mg  daily and may increase to twice a day during flares. Nothing to eat/drink afterwards for 30 minutes.  Okay to take Gaviscon as needed. Consider repeat EGD.

## 2021-12-24 NOTE — Assessment & Plan Note (Signed)
Past history - Perennial rhinoconjunctivitis symptoms for many years but worsening lately.  Tried Singulair, Zyrtec, Claritin, Flonase, Nasacort with some benefit. No prior sinus surgery.  Takes omeprazole for reflux. 2022 skin testing showed: Negative to indoor/outdoor allergens. Interim history - uses Flonase and Atrovent prn. Use Flonase (fluticasone) or Nasacort nasal spray 1 spray per nostril twice a day as needed for nasal congestion.  Use Atrovent (ipratropium) 0.03% 1-2 sprays per nostril twice a day as needed for runny nose/drainage. Nasal saline spray (i.e., Simply Saline) or nasal saline lavage (i.e., NeilMed) is recommended as needed and prior to medicated nasal sprays. Use over the counter antihistamines such as Zyrtec (cetirizine), Claritin (loratadine), Allegra (fexofenadine), or Xyzal (levocetirizine) daily as needed.  Refer to ENT [facility] - for non-allergic chronic rhinitis.

## 2021-12-24 NOTE — Patient Instructions (Addendum)
Breathing: Daily controller medication(s): continue Trelegy 1 puff once a day and rinse mouth afterwards.  May use albuterol rescue inhaler 2 puffs every 4 to 6 hours as needed for shortness of breath, chest tightness, coughing, and wheezing. Monitor frequency of use.  Asthma control goals:  Full participation in all desired activities (may need albuterol before activity) Albuterol use two times or less a week on average (not counting use with activity) Cough interfering with sleep two times or less a month Oral steroids no more than once a year No hospitalizations   Rhinitis: Use Flonase (fluticasone) or Nasacort nasal spray 1 spray per nostril twice a day as needed for nasal congestion.  Use Atrovent (ipratropium) 0.03% 1-2 sprays per nostril twice a day as needed for runny nose/drainage. Nasal saline spray (i.e., Simply Saline) or nasal saline lavage (i.e., NeilMed) is recommended as needed and prior to medicated nasal sprays. Use over the counter antihistamines such as Zyrtec (cetirizine), Claritin (loratadine), Allegra (fexofenadine), or Xyzal (levocetirizine) daily as needed.  Refer to ENT [facility] - for non-allergic chronic rhinitis.   Heartburn/reflux: Continue lifestyle and dietary modifications. Continue omeprazole 40mg  daily and may increase to twice a day during flares. Nothing to eat/drink afterwards for 30 minutes.  Okay to take Gaviscon as needed.  Follow up in 4 months or sooner if needed.

## 2021-12-24 NOTE — Telephone Encounter (Signed)
Per Dr Selena Batten pt needs referral to ENT for chronic non allergic rhinitis thank you

## 2021-12-24 NOTE — Assessment & Plan Note (Signed)
Past history - Patient had COVID-19 in January 2022,  May 2022, May 2023.  Since then he noticed issues with coughing and not being able to take a deep enough breath.  Ace inhibitor stopped due to coughing. 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 Clinically feeling improved.  Interim history - doing much better after he got Covid-19 booster. Today' spirometry showed some restriction. Daily controller medication(s): continue Trelegy 1 puff once a day and rinse mouth afterwards.  May use albuterol rescue inhaler 2 puffs every 4 to 6 hours as needed for shortness of breath, chest tightness, coughing, and wheezing. Monitor frequency of use.  Get spirometry at next visit.

## 2022-01-09 NOTE — Telephone Encounter (Signed)
Patient called back. Provided him with office name, address and phone number. Advised him to reach out to their office if he does not hear from them in 3-5 business days. Patient verbalized understanding.

## 2022-01-09 NOTE — Telephone Encounter (Signed)
Referral has been placed to [facility] [place] Formerly known [facility]  [address] [place], [place] [address] [phone] Valinda Hoar)  Called and left message for patient to call back to discuss.

## 2022-03-19 ENCOUNTER — Telehealth: Payer: Self-pay

## 2022-03-19 NOTE — Telephone Encounter (Signed)
Received faxed ENT office notes from patient referral.  Office notes have been placed in provider's in basket.  Forwarding message as update.

## 2022-04-21 NOTE — Progress Notes (Unsigned)
Follow Up Note  RE: Andrew Ware MRN: [id number] DOB: [birth year] Date of Office Visit: 04/22/2022  Referring provider: Curly Rim, MD Primary care provider: Curly Rim, MD  Chief Complaint: No chief complaint on file.  History of Present Illness: I had the pleasure of seeing Andrew Ware for a follow up visit at the [facility] [place] [facility] [place] on 04/21/2022. He is a 62 y.o. male, who is being followed for asthma, non-allergic rhinitis, GERD. His previous allergy office visit was on 12/24/2021 with Dr. Maudie Mercury. Today is a regular follow up visit.  Asthma Past history - Patient had COVID-19 in January 2022,  May 2022, May 2023.  Since then he noticed issues with coughing and not being able to take a deep enough breath.  Ace inhibitor stopped due to coughing. 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 Clinically feeling improved.  Interim history - doing much better after he got Covid-19 booster. Today' spirometry showed some restriction. Daily controller medication(s): continue Trelegy 22mcg 1 puff once a day and rinse mouth afterwards.  May use albuterol rescue inhaler 2 puffs every 4 to 6 hours as needed for shortness of breath, chest tightness, coughing, and wheezing. Monitor frequency of use.  Get spirometry at next visit.   Nonallergic rhinitis Past history - Perennial rhinoconjunctivitis symptoms for many years but worsening lately.  Tried Singulair, Zyrtec, Claritin, Flonase, Nasacort with some benefit. No prior sinus surgery.  Takes omeprazole for reflux. 2022 skin testing showed: Negative to indoor/outdoor allergens. Interim history - uses Flonase and Atrovent prn. Use Flonase (fluticasone) or Nasacort nasal spray 1 spray per nostril twice a day as needed for nasal congestion.  Use Atrovent (ipratropium) 0.03% 1-2 sprays per nostril twice a day as needed for runny nose/drainage. Nasal  saline spray (i.e., Simply Saline) or nasal saline lavage (i.e., NeilMed) is recommended as needed and prior to medicated nasal sprays. Use over the counter antihistamines such as Zyrtec (cetirizine), Claritin (loratadine), Allegra (fexofenadine), or Xyzal (levocetirizine) daily as needed.  Refer to ENT [facility] - for non-allergic chronic rhinitis.    Gastroesophageal reflux disease Stable with omeprazole 40mg  and Gaviscon prn.  Continue lifestyle and dietary modifications. Continue omeprazole 40mg  daily and may increase to twice a day during flares. Nothing to eat/drink afterwards for 30 minutes.  Okay to take Gaviscon as needed. Consider repeat EGD.   Return in about 4 months (around 04/24/2022).  03/13/2022 ENT visit: "Nasal septal deviation with minimal symptoms related to that. Surgical intervention is not indicated at this point.  No clinical evidence of chronic sinus disease although he is just recovering from his most recent COVID and was treated with steroids and antibiotics. If he continues to have any ongoing symptoms after another month or 2 then he is to contact us and we will set him up for CT imaging of the sinuses.  Chronic reflux from excessive caffeine and alcohol consumption.We discussed causes of reflux, including lifestyle and dietary factors. Recommend strict avoidance of all tobacco, caffeine, alcohol, chocolate and peppermint. A reflux handout with more detailed instructions was provided to the patient."  Assessment and Plan: Andrew Ware is a 62 y.o. male with: No problem-specific Assessment & Plan notes found for this encounter.  No follow-ups on file.  No orders of the defined types were placed in this encounter.  Lab Orders  No laboratory test(s) ordered today    Diagnostics: Spirometry:  Tracings reviewed. His effort: {Blank single:19197::"Good  reproducible efforts.","It was hard to get consistent efforts and there is a question as to whether this reflects a  maximal maneuver.","Poor effort, data can not be interpreted."} FVC: ***L FEV1: ***L, ***% predicted FEV1/FVC ratio: ***% Interpretation: {Blank single:19197::"Spirometry consistent with mild obstructive disease","Spirometry consistent with moderate obstructive disease","Spirometry consistent with severe obstructive disease","Spirometry consistent with possible restrictive disease","Spirometry consistent with mixed obstructive and restrictive disease","Spirometry uninterpretable due to technique","Spirometry consistent with normal pattern","No overt abnormalities noted given today's efforts"}.  Please see scanned spirometry results for details.  Skin Testing: {Blank single:19197::"Select foods","Environmental allergy panel","Environmental allergy panel and select foods","Food allergy panel","None","Deferred due to recent antihistamines use"}. *** Results discussed with patient/family.   Medication List:  Current Outpatient Medications  Medication Sig Dispense Refill   albuterol (VENTOLIN HFA) 108 (90 Base) MCG/ACT inhaler Inhale 2 puffs into the lungs every 4 (four) hours as needed for wheezing or shortness of breath. 18 g 1   amLODipine (NORVASC) 5 MG tablet Take 5 mg by mouth daily.     aspirin 81 MG EC tablet Take by mouth.     atorvastatin (LIPITOR) 10 MG tablet Take 10 mg by mouth daily.     fluticasone (FLONASE) 50 MCG/ACT nasal spray one spray by Both Nostrils route daily.     ipratropium (ATROVENT) 0.03 % nasal spray Place 1 spray into both nostrils 2 (two) times daily as needed (nasal drainage). 30 mL 5   irbesartan (AVAPRO) 150 MG tablet Take 150 mg by mouth 2 (two) times daily.     loratadine (CLARITIN) 10 MG tablet Take by mouth.     Lutein 6 MG CAPS Take by mouth.     meloxicam (MOBIC) 15 MG tablet Take by mouth.     metoprolol (TOPROL-XL) 200 MG 24 hr tablet Take 1 tablet by mouth daily.     Multiple Vitamins-Minerals (MULTIVITAMIN & MINERAL PO) Take 1 tablet by mouth daily.      Omega-3 Fatty Acids (FISH OIL) 1000 MG CAPS Take 1 capsule by mouth daily.     omeprazole (PRILOSEC) 40 MG capsule Take 40 mg by mouth daily.     TRELEGY ELLIPTA 200-62.5-25 MCG/ACT AEPB INHALE 1 PUFF INTO THE LUNGS DAILY. RINSE MOUTH AFTER USE 60 each 5   triamcinolone (NASACORT) 55 MCG/ACT AERO nasal inhaler two sprays by Both Nostrils route daily.     No current facility-administered medications for this visit.   Allergies: No Known Allergies I reviewed his past medical history, social history, family history, and environmental history and no significant changes have been reported from his previous visit.  Review of Systems  Constitutional:  Negative for appetite change, chills, fever and unexpected weight change.  HENT:  Negative for congestion and postnasal drip.   Eyes:  Negative for itching.  Respiratory:  Negative for cough, chest tightness, shortness of breath and wheezing.   Cardiovascular:  Negative for chest pain.  Gastrointestinal:  Negative for abdominal pain.  Genitourinary:  Negative for difficulty urinating.  Skin:  Negative for rash.  Allergic/Immunologic: Negative for environmental allergies.  Neurological:  Negative for headaches.    Objective: There were no vitals taken for this visit. There is no height or weight on file to calculate BMI. Physical Exam Vitals and nursing note reviewed.  Constitutional:      Appearance: Normal appearance. He is well-developed.  HENT:     Head: Normocephalic and atraumatic.     Right Ear: Tympanic membrane and external ear normal.     Left Ear: Tympanic membrane and  external ear normal.     Nose: Nose normal.     Mouth/Throat:     Mouth: Mucous membranes are moist.     Pharynx: Oropharynx is clear.  Eyes:     Conjunctiva/sclera: Conjunctivae normal.  Cardiovascular:     Rate and Rhythm: Normal rate and regular rhythm.     Heart sounds: Normal heart sounds. No murmur heard.    No friction rub. No gallop.  Pulmonary:      Effort: Pulmonary effort is normal.     Breath sounds: Normal breath sounds. No wheezing, rhonchi or rales.  Musculoskeletal:     Cervical back: Neck supple.  Skin:    General: Skin is warm.     Findings: No rash.  Neurological:     Mental Status: He is alert and oriented to person, place, and time.  Psychiatric:        Behavior: Behavior normal.    Previous notes and tests were reviewed. The plan was reviewed with the patient/family, and all questions/concerned were addressed.  It was my pleasure to see Koltin today and participate in his care. Please feel free to contact me with any questions or concerns.  Sincerely,  Rexene Alberts, DO Allergy & Immunology  [facility] office: [place] office: [phone]

## 2022-04-22 ENCOUNTER — Encounter: Payer: Self-pay | Admitting: Allergy

## 2022-04-22 ENCOUNTER — Other Ambulatory Visit: Payer: Self-pay

## 2022-04-22 ENCOUNTER — Ambulatory Visit (INDEPENDENT_AMBULATORY_CARE_PROVIDER_SITE_OTHER): Payer: BC Managed Care – PPO | Admitting: Allergy

## 2022-04-22 VITALS — BP 128/72 | HR 87 | Temp 98.0°F | Resp 20 | Ht 70.0 in | Wt 201.0 lb

## 2022-04-22 DIAGNOSIS — K219 Gastro-esophageal reflux disease without esophagitis: Secondary | ICD-10-CM

## 2022-04-22 DIAGNOSIS — J310 Chronic rhinitis: Secondary | ICD-10-CM

## 2022-04-22 DIAGNOSIS — J4540 Moderate persistent asthma, uncomplicated: Secondary | ICD-10-CM

## 2022-04-22 MED ORDER — TRELEGY ELLIPTA 200-62.5-25 MCG/ACT IN AEPB: 1.0000 | INHALATION_SPRAY | Freq: Every day | RESPIRATORY_TRACT | 11 refills | Status: DC

## 2022-04-22 NOTE — Assessment & Plan Note (Signed)
Improved with dietary changes.  Continue lifestyle and dietary modifications. Continue omeprazole 40mg  daily and may increase to twice a day during flares. Nothing to eat/drink afterwards for 30 minutes.  Okay to take Gaviscon as needed.

## 2022-04-22 NOTE — Assessment & Plan Note (Signed)
Past history - Patient had COVID-19 in January 2022,  May 2022, May 2023.  Since then he noticed issues with coughing and not being able to take a deep enough breath.  Ace inhibitor stopped due to coughing. 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 Clinically feeling improved.  Interim history - had Covid-19 again in January requiring prednisone and amox afterwards. Had about 3 courses of prednisone the past year. Up to date with Covid-19 vaccinations.  Today' spirometry showed some restriction. Get bloodwork to see if he qualifies for any biologics for asthma.  Daily controller medication(s): continue Trelegy 211mcg 1 puff once a day and rinse mouth afterwards.  May use albuterol rescue inhaler 2 puffs every 4 to 6 hours as needed for shortness of breath, chest tightness, coughing, and wheezing. Monitor frequency of use.  Get spirometry at next visit.

## 2022-04-22 NOTE — Patient Instructions (Addendum)
Breathing: Get bloodwork to see if you qualify for any injectables for asthma.  We are ordering labs, so please allow 1-2 weeks for the results to come back. With the newly implemented Cures Act, the labs might be visible to you at the same time that they become visible to me. However, I will not address the results until all of the results are back, so please be patient.  In the meantime, continue recommendations in your patient instructions, including avoidance measures (if applicable), until you hear from me.  Daily controller medication(s): continue Trelegy 28mcg 1 puff once a day and rinse mouth afterwards.  May use albuterol rescue inhaler 2 puffs every 4 to 6 hours as needed for shortness of breath, chest tightness, coughing, and wheezing. Monitor frequency of use.  Asthma control goals:  Full participation in all desired activities (may need albuterol before activity) Albuterol use two times or less a week on average (not counting use with activity) Cough interfering with sleep two times or less a month Oral steroids no more than once a year No hospitalizations   Rhinitis: Use Flonase (fluticasone) or Nasacort nasal spray 1 spray per nostril twice a day as needed for nasal congestion.  Use Atrovent (ipratropium) 0.03% 1-2 sprays per nostril twice a day as needed for runny nose/drainage. Nasal saline spray (i.e., Simply Saline) or nasal saline lavage (i.e., NeilMed) is recommended as needed and prior to medicated nasal sprays. Use over the counter antihistamines such as Zyrtec (cetirizine), Claritin (loratadine), Allegra (fexofenadine), or Xyzal (levocetirizine) daily as needed.   Heartburn/reflux: Continue lifestyle and dietary modifications. Continue omeprazole 40mg  daily and may increase to twice a day during flares. Nothing to eat/drink afterwards for 30 minutes.  Okay to take Gaviscon as needed.  Follow up in 4 months or sooner if needed.

## 2022-04-22 NOTE — Assessment & Plan Note (Signed)
Past history - Perennial rhinoconjunctivitis symptoms for many years but worsening lately.  Tried Singulair, Zyrtec, Claritin, Flonase, Nasacort with some benefit. No prior sinus surgery.  Takes omeprazole for reflux. 2022 skin testing showed: Negative to indoor/outdoor allergens. Interim history - saw ENT, no surgery for now.  Use Flonase (fluticasone) or Nasacort nasal spray 1 spray per nostril twice a day as needed for nasal congestion.  Use Atrovent (ipratropium) 0.03% 1-2 sprays per nostril twice a day as needed for runny nose/drainage. Nasal saline spray (i.e., Simply Saline) or nasal saline lavage (i.e., NeilMed) is recommended as needed and prior to medicated nasal sprays. Use over the counter antihistamines such as Zyrtec (cetirizine), Claritin (loratadine), Allegra (fexofenadine), or Xyzal (levocetirizine) daily as needed.

## 2022-04-28 ENCOUNTER — Encounter: Payer: Self-pay | Admitting: Allergy

## 2022-04-28 ENCOUNTER — Telehealth: Payer: Self-pay | Admitting: Allergy

## 2022-04-28 LAB — CBC WITH DIFFERENTIAL/PLATELET
Basophils Absolute: 0 10*3/uL (ref 0.0–0.2)
Basos: 0 %
EOS (ABSOLUTE): 0.1 10*3/uL (ref 0.0–0.4)
Eos: 1 %
Hematocrit: 44.2 % (ref 37.5–51.0)
Hemoglobin: 14.4 g/dL (ref 13.0–17.7)
Immature Grans (Abs): 0 10*3/uL (ref 0.0–0.1)
Immature Granulocytes: 0 %
Lymphocytes Absolute: 1.7 10*3/uL (ref 0.7–3.1)
Lymphs: 21 %
MCH: 31.1 pg (ref 26.6–33.0)
MCHC: 32.6 g/dL (ref 31.5–35.7)
MCV: 96 fL (ref 79–97)
Monocytes Absolute: 0.6 10*3/uL (ref 0.1–0.9)
Monocytes: 8 %
Neutrophils Absolute: 5.7 10*3/uL (ref 1.4–7.0)
Neutrophils: 70 %
Platelets: 272 10*3/uL (ref 150–450)
RBC: 4.63 x10E6/uL (ref 4.14–5.80)
RDW: 12.2 % (ref 11.6–15.4)
WBC: 8.1 10*3/uL (ref 3.4–10.8)

## 2022-04-28 LAB — IGE: IgE (Immunoglobulin E), Serum: 197 IU/mL (ref 6–495)

## 2022-04-28 NOTE — Telephone Encounter (Signed)
Please start PA for Tezspire every 4 weeks for asthma.  Thank you.

## 2022-04-30 NOTE — Telephone Encounter (Signed)
Noted  

## 2022-04-30 NOTE — Telephone Encounter (Signed)
Spoke to patient and he advised he does not want to start therapy that he feels he does not need same

## 2022-04-30 NOTE — Telephone Encounter (Signed)
L/M for patient to contact me to advise approval, copay card and submit to [facility] for [employer]

## 2022-07-07 ENCOUNTER — Other Ambulatory Visit: Payer: Self-pay | Admitting: Allergy

## 2022-08-25 NOTE — Progress Notes (Unsigned)
Follow Up Note  RE: Andrew Ware MRN: [id number] DOB: Aug 17, 1960 Date of Office Visit: 08/26/2022  Referring provider: Vivien Presto, MD Primary care provider: Vivien Presto, MD  Chief Complaint: No chief complaint on file.  History of Present Illness: I had the pleasure of seeing [name] for a follow up visit at the [facility] on 08/25/2022. He is a 62 y.o. male, who is being followed for asthma, nonallergic rhinitis and GERD. His previous allergy office visit was on 04/22/2022 with Dr. Selena Batten. Today is a regular follow up visit.  Yes Dr. Selena Batten would like for you to continue Trelegy 200 mcg 1 puff a day, in addition to Tezspire.   Asthma Past history - Patient had COVID-19 in January 2022,  May 2022, May 2023.  Since then he noticed issues with coughing and not being able to take a deep enough breath.  Ace inhibitor stopped due to coughing. 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 Clinically feeling improved.  Interim history - had Covid-19 again in January requiring prednisone and amox afterwards. Had about 3 courses of prednisone the past year. Up to date with Covid-19 vaccinations.  Today' spirometry showed some restriction. Get bloodwork to see if he qualifies for any biologics for asthma.  Daily controller medication(s): continue Trelegy 1 puff once a day and rinse mouth afterwards.  May use albuterol rescue inhaler 2 puffs every 4 to 6 hours as needed for shortness of breath, chest tightness, coughing, and wheezing. Monitor frequency of use.  Get spirometry at next visit.   Nonallergic rhinitis Past history - Perennial rhinoconjunctivitis symptoms for many years but worsening lately.  Tried Singulair, Zyrtec, Claritin, Flonase, Nasacort with some benefit. No prior sinus surgery.  Takes omeprazole for reflux. 2022 skin testing showed: Negative to indoor/outdoor  allergens. Interim history - saw ENT, no surgery for now.  Use Flonase (fluticasone) or Nasacort nasal spray 1 spray per nostril twice a day as needed for nasal congestion.  Use Atrovent (ipratropium) 0.03% 1-2 sprays per nostril twice a day as needed for runny nose/drainage. Nasal saline spray (i.e., Simply Saline) or nasal saline lavage (i.e., NeilMed) is recommended as needed and prior to medicated nasal sprays. Use over the counter antihistamines such as Zyrtec (cetirizine), Claritin (loratadine), Allegra (fexofenadine), or Xyzal (levocetirizine) daily as needed.   Gastroesophageal reflux disease Improved with dietary changes.  Continue lifestyle and dietary modifications. Continue omeprazole 40mg  daily and may increase to twice a day during flares. Nothing to eat/drink afterwards for 30 minutes.  Okay to take Gaviscon as needed.   Return in about 4 months (around 08/22/2022).  Assessment and Plan: [name] is a 62 y.o. male with: No problem-specific Assessment & Plan notes found for this encounter.  No follow-ups on file.  No orders of the defined types were placed in this encounter.  Lab Orders  No laboratory test(s) ordered today    Diagnostics: Spirometry:  Tracings reviewed. His effort: {Blank single:19197::"Good reproducible efforts.","It was hard to get consistent efforts and there is a question as to whether this reflects a maximal maneuver.","Poor effort, data can not be interpreted."} FVC: ***L FEV1: ***L, ***% predicted FEV1/FVC ratio: ***% Interpretation: {Blank single:19197::"Spirometry consistent with mild obstructive disease","Spirometry consistent with moderate obstructive disease","Spirometry consistent with severe obstructive disease","Spirometry consistent with possible restrictive disease","Spirometry consistent with mixed obstructive and restrictive disease","Spirometry uninterpretable due to technique","Spirometry consistent with normal pattern","No overt  abnormalities noted given  today's efforts"}.  Please see scanned spirometry results for details.  Skin Testing: {Blank single:19197::"Select foods","Environmental allergy panel","Environmental allergy panel and select foods","Food allergy panel","None","Deferred due to recent antihistamines use"}. *** Results discussed with patient/family.   Medication List:  Current Outpatient Medications  Medication Sig Dispense Refill   amLODipine (NORVASC) 5 MG tablet Take 5 mg by mouth daily.     aspirin 81 MG EC tablet Take by mouth.     atorvastatin (LIPITOR) 10 MG tablet Take 10 mg by mouth daily.     fluticasone (FLONASE) 50 MCG/ACT nasal spray one spray by Both Nostrils route daily.     Fluticasone-Umeclidin-Vilant (TRELEGY ELLIPTA) 200-62.5-25 MCG/ACT AEPB Inhale 1 puff into the lungs daily. Rinse mouth after each use. 60 each 11   ipratropium (ATROVENT) 0.03 % nasal spray Place 1 spray into both nostrils 2 (two) times daily as needed (nasal drainage). 30 mL 5   irbesartan (AVAPRO) 150 MG tablet Take 150 mg by mouth 2 (two) times daily.     loratadine (CLARITIN) 10 MG tablet Take by mouth.     Lutein 6 MG CAPS Take by mouth.     meloxicam (MOBIC) 15 MG tablet Take by mouth.     metoprolol (TOPROL-XL) 200 MG 24 hr tablet Take 1 tablet by mouth daily.     Multiple Vitamins-Minerals (MULTIVITAMIN & MINERAL PO) Take 1 tablet by mouth daily.     Omega-3 Fatty Acids (FISH OIL) 1000 MG CAPS Take 1 capsule by mouth daily.     omeprazole (PRILOSEC) 40 MG capsule Take 40 mg by mouth daily.     triamcinolone (NASACORT) 55 MCG/ACT AERO nasal inhaler two sprays by Both Nostrils route daily. (Patient not taking: Reported on 04/22/2022)     VENTOLIN HFA 108 (90 Base) MCG/ACT inhaler INHALE 2 PUFFS INTO THE LUNGS EVERY 4 HOURS AS NEEDED FOR WHEEZING OR SHORTNESS OF BREATH. 18 each 1   No current facility-administered medications for this visit.   Allergies: No Known Allergies I reviewed his past medical  history, social history, family history, and environmental history and no significant changes have been reported from his previous visit.  Review of Systems  Constitutional:  Negative for appetite change, chills, fever and unexpected weight change.  HENT:  Negative for congestion and postnasal drip.   Eyes:  Negative for itching.  Respiratory:  Negative for cough, chest tightness, shortness of breath and wheezing.   Cardiovascular:  Negative for chest pain.  Gastrointestinal:  Negative for abdominal pain.  Genitourinary:  Negative for difficulty urinating.  Skin:  Negative for rash.  Allergic/Immunologic: Negative for environmental allergies.  Neurological:  Negative for headaches.    Objective: There were no vitals taken for this visit. There is no height or weight on file to calculate BMI. Physical Exam Vitals and nursing note reviewed.  Constitutional:      Appearance: Normal appearance. He is well-developed.  HENT:     Head: Normocephalic and atraumatic.     Right Ear: Tympanic membrane and external ear normal.     Left Ear: Tympanic membrane and external ear normal.     Nose: Nose normal.     Mouth/Throat:     Mouth: Mucous membranes are moist.     Pharynx: Oropharynx is clear.  Eyes:     Conjunctiva/sclera: Conjunctivae normal.  Cardiovascular:     Rate and Rhythm: Normal rate and regular rhythm.     Heart sounds: Normal heart sounds. No murmur heard.    No friction rub.  No gallop.  Pulmonary:     Effort: Pulmonary effort is normal.     Breath sounds: Normal breath sounds. No wheezing, rhonchi or rales.  Musculoskeletal:     Cervical back: Neck supple.  Skin:    General: Skin is warm.     Findings: No rash.  Neurological:     Mental Status: He is alert and oriented to person, place, and time.  Psychiatric:        Behavior: Behavior normal.    Previous notes and tests were reviewed. The plan was reviewed with the patient/family, and all questions/concerned were  addressed.  It was my pleasure to see Nisaiah today and participate in his care. Please feel free to contact me with any questions or concerns.  Sincerely,  Wyline Mood, DO Allergy & Immunology  [facility] office: [phone] [facility] office: [phone]

## 2022-08-26 ENCOUNTER — Encounter: Payer: Self-pay | Admitting: Allergy

## 2022-08-26 ENCOUNTER — Ambulatory Visit (INDEPENDENT_AMBULATORY_CARE_PROVIDER_SITE_OTHER): Payer: BC Managed Care – PPO | Admitting: Allergy

## 2022-08-26 VITALS — BP 126/88 | HR 83 | Temp 98.5°F | Resp 16 | Ht 70.0 in | Wt 201.0 lb

## 2022-08-26 DIAGNOSIS — J4540 Moderate persistent asthma, uncomplicated: Secondary | ICD-10-CM | POA: Diagnosis not present

## 2022-08-26 DIAGNOSIS — J310 Chronic rhinitis: Secondary | ICD-10-CM | POA: Diagnosis not present

## 2022-08-26 DIAGNOSIS — K219 Gastro-esophageal reflux disease without esophagitis: Secondary | ICD-10-CM

## 2022-08-26 NOTE — Patient Instructions (Addendum)
Breathing: If you have issues with your breathing let us know. Daily controller medication(s): continue Trelegy 1 puff once a day and rinse mouth afterwards.  During respiratory infections/flares:  Pretreat with albuterol 2 puffs. May use albuterol rescue inhaler 2 puffs every 4 to 6 hours as needed for shortness of breath, chest tightness, coughing, and wheezing. May use albuterol rescue inhaler 2 puffs 5 to 15 minutes prior to strenuous physical activities. Monitor frequency of use - if you need to use it more than twice per week on a consistent basis let us know.  Breathing control goals:  Full participation in all desired activities (may need albuterol before activity) Albuterol use two times or less a week on average (not counting use with activity) Cough interfering with sleep two times or less a month Oral steroids no more than once a year No hospitalizations  Rhinitis: Use Flonase (fluticasone) nasal spray 1 spray per nostril twice a day as needed for nasal congestion.  Use Atrovent (ipratropium) 0.03% 1-2 sprays per nostril twice a day as needed for runny nose/drainage. Nasal saline spray (i.e., Simply Saline) or nasal saline lavage (i.e., NeilMed) is recommended as needed and prior to medicated nasal sprays. Use over the counter antihistamines such as Zyrtec (cetirizine), Claritin (loratadine), Allegra (fexofenadine), or Xyzal (levocetirizine) daily as needed.   Heartburn/reflux: Continue lifestyle and dietary modifications. Continue omeprazole 40mg  daily and may increase to twice a day during flares. Nothing to eat/drink afterwards for 30 minutes.  Okay to take Gaviscon as needed.  Follow up in 4 months or sooner if needed.  Get flu vaccine in the fall. Get RSV vaccine.

## 2022-08-26 NOTE — Assessment & Plan Note (Signed)
Continue lifestyle and dietary modifications. Continue omeprazole 40mg  daily and may increase to twice a day during flares. Nothing to eat/drink afterwards for 30 minutes.  Okay to take Gaviscon as needed.

## 2022-08-26 NOTE — Assessment & Plan Note (Signed)
Past history - Perennial rhinoconjunctivitis symptoms for many years but worsening lately.  Tried Singulair, Zyrtec, Claritin, Flonase, Nasacort with some benefit. No prior sinus surgery.  Takes omeprazole for reflux. 2022 skin testing showed: Negative to indoor/outdoor allergens. Saw ENT, no surgery for now.  Interim history - controlled with Flonase. Use Flonase (fluticasone) nasal spray 1 spray per nostril twice a day as needed for nasal congestion.  Use Atrovent (ipratropium) 0.03% 1-2 sprays per nostril twice a day as needed for runny nose/drainage. Nasal saline spray (i.e., Simply Saline) or nasal saline lavage (i.e., NeilMed) is recommended as needed and prior to medicated nasal sprays. Use over the counter antihistamines such as Zyrtec (cetirizine), Claritin (loratadine), Allegra (fexofenadine), or Xyzal (levocetirizine) daily as needed.

## 2022-08-26 NOTE — Assessment & Plan Note (Signed)
Past history - Patient had COVID-19 in January 2022,  May 2022, May 2023.  Since then he noticed issues with coughing and not being able to take a deep enough breath.  Ace inhibitor stopped due to coughing. 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 Clinically feeling improved. Had Covid-19 again in January requiring prednisone and amox afterwards. Had about 3 courses of prednisone the past year. Up to date with Covid-19 vaccinations.  Interim history - 2024 bloodwork eos 100, IgE 197.  Today' spirometry was normal. If you have issues with your breathing let us know. Patient wants to hold off starting biologics - Tezspire.  Daily controller medication(s): continue Trelegy 1 puff once a day and rinse mouth afterwards.  During respiratory infections/flares:  Pretreat with albuterol 2 puffs. May use albuterol rescue inhaler 2 puffs every 4 to 6 hours as needed for shortness of breath, chest tightness, coughing, and wheezing. May use albuterol rescue inhaler 2 puffs 5 to 15 minutes prior to strenuous physical activities. Monitor frequency of use - if you need to use it more than twice per week on a consistent basis let us know.  Get flu vaccine in the fall. Get RSV vaccine.

## 2022-12-22 NOTE — Progress Notes (Unsigned)
Follow Up Note  RE: Andrew Ware MRN: [id number] DOB: 03-31-1960 Date of Office Visit: 12/23/2022  Referring provider: Vivien Presto, MD Primary care provider: Vivien Presto, MD  Chief Complaint: No chief complaint on file.  History of Present Illness: I had the pleasure of seeing Andrew Ware for a follow up visit at the [facility] on 12/22/2022. He is a 62 y.o. male, who is being followed for asthma, chronic rhinitis, GERD. His previous allergy office visit was on 04/22/2022 with Dr. Selena Batten. Today is a regular follow up visit.  Discussed the use of AI scribe software for clinical note transcription with the patient, who gave verbal consent to proceed.  History of Present Illness            Asthma Past history - Patient had COVID-19 in January 2022,  May 2022, May 2023.  Since then he noticed issues with coughing and not being able to take a deep enough breath.  Ace inhibitor stopped due to coughing. 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 Clinically feeling improved.  Interim history - had Covid-19 again in January requiring prednisone and amox afterwards. Had about 3 courses of prednisone the past year. Up to date with Covid-19 vaccinations.  Today' spirometry showed some restriction. Get bloodwork to see if he qualifies for any biologics for asthma.  Daily controller medication(s): continue Trelegy 1 puff once a day and rinse mouth afterwards.  May use albuterol rescue inhaler 2 puffs every 4 to 6 hours as needed for shortness of breath, chest tightness, coughing, and wheezing. Monitor frequency of use.  Get spirometry at next visit.   Nonallergic rhinitis Past history - Perennial rhinoconjunctivitis symptoms for many years but worsening lately.  Tried Singulair, Zyrtec, Claritin, Flonase, Nasacort with some benefit. No prior sinus surgery.  Takes omeprazole for  reflux. 2022 skin testing showed: Negative to indoor/outdoor allergens. Interim history - saw ENT, no surgery for now.  Use Flonase (fluticasone) or Nasacort nasal spray 1 spray per nostril twice a day as needed for nasal congestion.  Use Atrovent (ipratropium) 0.03% 1-2 sprays per nostril twice a day as needed for runny nose/drainage. Nasal saline spray (i.e., Simply Saline) or nasal saline lavage (i.e., NeilMed) is recommended as needed and prior to medicated nasal sprays. Use over the counter antihistamines such as Zyrtec (cetirizine), Claritin (loratadine), Allegra (fexofenadine), or Xyzal (levocetirizine) daily as needed.   Gastroesophageal reflux disease Improved with dietary changes.  Continue lifestyle and dietary modifications. Continue omeprazole 40mg  daily and may increase to twice a day during flares. Nothing to eat/drink afterwards for 30 minutes.  Okay to take Gaviscon as needed.   Return in about 4 months (around 08/22/2022).  Assessment and Plan: Andrew Ware is a 62 y.o. male with: Moderate persistent asthma without complication Past history - Patient had COVID-19 in January 2022,  May 2022, May 2023.  Since then he noticed issues with coughing and not being able to take a deep enough breath.  Ace inhibitor stopped due to coughing. 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 Clinically feeling improved.  Interim history -   Chronic rhinitis Past history - Perennial rhinoconjunctivitis symptoms for many years but worsening lately.  Tried Singulair, Zyrtec, Claritin, Flonase, Nasacort with some benefit. No prior sinus surgery.  Takes omeprazole for reflux. 2022 skin testing negative to indoor/outdoor allergens. ENT evaluation - no surgery.  Interim history -   Gastroesophageal reflux disease, unspecified whether esophagitis present ***  Assessment and Plan              No follow-ups on  file.  No orders of the defined types were placed in this encounter.  Lab Orders  No laboratory test(s) ordered today    Diagnostics: Spirometry:  Tracings reviewed. His effort: {Blank single:19197::"Good reproducible efforts.","It was hard to get consistent efforts and there is a question as to whether this reflects a maximal maneuver.","Poor effort, data can not be interpreted."} FVC: ***L FEV1: ***L, ***% predicted FEV1/FVC ratio: ***% Interpretation: {Blank single:19197::"Spirometry consistent with mild obstructive disease","Spirometry consistent with moderate obstructive disease","Spirometry consistent with severe obstructive disease","Spirometry consistent with possible restrictive disease","Spirometry consistent with mixed obstructive and restrictive disease","Spirometry uninterpretable due to technique","Spirometry consistent with normal pattern","No overt abnormalities noted given today's efforts"}.  Please see scanned spirometry results for details.  Skin Testing: {Blank single:19197::"Select foods","Environmental allergy panel","Environmental allergy panel and select foods","Food allergy panel","None","Deferred due to recent antihistamines use"}. *** Results discussed with patient/family.   Medication List:  Current Outpatient Medications  Medication Sig Dispense Refill   amLODipine (NORVASC) 5 MG tablet Take 5 mg by mouth daily.     aspirin 81 MG EC tablet Take by mouth.     atorvastatin (LIPITOR) 10 MG tablet Take 10 mg by mouth daily.     fluticasone (FLONASE) 50 MCG/ACT nasal spray one spray by Both Nostrils route daily.     Fluticasone-Umeclidin-Vilant (TRELEGY ELLIPTA) 200-62.5-25 MCG/ACT AEPB Inhale 1 puff into the lungs daily. Rinse mouth after each use. 60 each 11   ipratropium (ATROVENT) 0.03 % nasal spray Place 1 spray into both nostrils 2 (two) times daily as needed (nasal drainage). 30 mL 5   irbesartan (AVAPRO) 150 MG tablet Take 150 mg by mouth 2 (two) times  daily.     Lutein 6 MG CAPS Take by mouth.     meloxicam (MOBIC) 15 MG tablet Take by mouth.     metoprolol (TOPROL-XL) 200 MG 24 hr tablet Take 1 tablet by mouth daily.     Multiple Vitamins-Minerals (MULTIVITAMIN & MINERAL PO) Take 1 tablet by mouth daily.     Omega-3 Fatty Acids (FISH OIL) 1000 MG CAPS Take 1 capsule by mouth daily.     omeprazole (PRILOSEC) 40 MG capsule Take 40 mg by mouth daily.     VENTOLIN HFA 108 (90 Base) MCG/ACT inhaler INHALE 2 PUFFS INTO THE LUNGS EVERY 4 HOURS AS NEEDED FOR WHEEZING OR SHORTNESS OF BREATH. 18 each 1   No current facility-administered medications for this visit.   Allergies: No Known Allergies I reviewed his past medical history, social history, family history, and environmental history and no significant changes have been reported from his previous visit.  Review of Systems  Constitutional:  Negative for appetite change, chills, fever and unexpected weight change.  HENT:  Negative for congestion and postnasal drip.   Eyes:  Negative for itching.  Respiratory:  Negative for cough, chest tightness, shortness of breath and wheezing.   Cardiovascular:  Negative for chest pain.  Gastrointestinal:  Negative for abdominal pain.  Genitourinary:  Negative for difficulty urinating.  Skin:  Negative for rash.  Allergic/Immunologic: Negative for environmental allergies.  Neurological:  Negative for headaches.    Objective: There were no vitals taken for this visit. There is no height or weight on file to calculate BMI. Physical Exam Vitals and nursing note reviewed.  Constitutional:  Appearance: Normal appearance. He is well-developed.  HENT:     Head: Normocephalic and atraumatic.     Right Ear: Tympanic membrane and external ear normal.     Left Ear: Tympanic membrane and external ear normal.     Nose: Nose normal.     Mouth/Throat:     Mouth: Mucous membranes are moist.     Pharynx: Oropharynx is clear.  Eyes:      Conjunctiva/sclera: Conjunctivae normal.  Cardiovascular:     Rate and Rhythm: Normal rate and regular rhythm.     Heart sounds: Normal heart sounds. No murmur heard.    No friction rub. No gallop.  Pulmonary:     Effort: Pulmonary effort is normal.     Breath sounds: Normal breath sounds. No wheezing, rhonchi or rales.  Musculoskeletal:     Cervical back: Neck supple.  Skin:    General: Skin is warm.     Findings: No rash.  Neurological:     Mental Status: He is alert and oriented to person, place, and time.  Psychiatric:        Behavior: Behavior normal.    Previous notes and tests were reviewed. The plan was reviewed with the patient/family, and all questions/concerned were addressed.  It was my pleasure to see Andrew Ware today and participate in his care. Please feel free to contact me with any questions or concerns.  Sincerely,  Wyline Mood, DO Allergy & Immunology  [facility] office: [phone] [facility] office: [phone]

## 2022-12-23 ENCOUNTER — Ambulatory Visit (INDEPENDENT_AMBULATORY_CARE_PROVIDER_SITE_OTHER): Payer: BC Managed Care – PPO | Admitting: Allergy

## 2022-12-23 ENCOUNTER — Encounter: Payer: Self-pay | Admitting: Allergy

## 2022-12-23 VITALS — BP 120/70 | HR 88 | Temp 97.2°F | Resp 18

## 2022-12-23 DIAGNOSIS — K219 Gastro-esophageal reflux disease without esophagitis: Secondary | ICD-10-CM | POA: Diagnosis not present

## 2022-12-23 DIAGNOSIS — J310 Chronic rhinitis: Secondary | ICD-10-CM

## 2022-12-23 DIAGNOSIS — J4540 Moderate persistent asthma, uncomplicated: Secondary | ICD-10-CM

## 2022-12-23 MED ORDER — FLUTICASONE PROPIONATE 50 MCG/ACT NA SUSP: 1.0000 | Freq: Every day | NASAL | 3 refills | Status: DC | PRN

## 2022-12-23 NOTE — Patient Instructions (Addendum)
Breathing: Daily controller medication(s): continue Trelegy 1 puff once a day and rinse mouth afterwards.  During respiratory infections/flares:  Pretreat with albuterol 2 puffs. May use albuterol rescue inhaler 2 puffs every 4 to 6 hours as needed for shortness of breath, chest tightness, coughing, and wheezing. May use albuterol rescue inhaler 2 puffs 5 to 15 minutes prior to strenuous physical activities. Monitor frequency of use - if you need to use it more than twice per week on a consistent basis let us know.  Breathing control goals:  Full participation in all desired activities (may need albuterol before activity) Albuterol use two times or less a week on average (not counting use with activity) Cough interfering with sleep two times or less a month Oral steroids no more than once a year No hospitalizations  Rhinitis: Use Atrovent (ipratropium) 0.03% 1-2 sprays per nostril twice a day as needed for runny nose/drainage. Use Flonase (fluticasone) nasal spray 1-2 sprays per nostril once a day as needed for nasal congestion.  Nasal saline spray (i.e., Simply Saline) or nasal saline lavage (i.e., NeilMed) is recommended as needed and prior to medicated nasal sprays. Use over the counter antihistamines such as Zyrtec (cetirizine), Claritin (loratadine), Allegra (fexofenadine), or Xyzal (levocetirizine) daily as needed.   Heartburn/reflux: Continue lifestyle and dietary modifications. Continue omeprazole 40mg  daily and may increase to twice a day during flares. Nothing to eat/drink afterwards for 30 minutes.  Okay to take Gaviscon as needed.  Skin Follow up with dermatology as scheduled.   Follow up in 6 months or sooner if needed.

## 2022-12-24 ENCOUNTER — Other Ambulatory Visit: Payer: Self-pay

## 2022-12-24 ENCOUNTER — Encounter: Payer: Self-pay | Admitting: Allergy

## 2023-06-02 ENCOUNTER — Telehealth: Payer: Self-pay

## 2023-06-02 NOTE — Telephone Encounter (Signed)
 Sample given

## 2023-06-08 NOTE — Telephone Encounter (Signed)
 Pt request a refill for Trelogy, pt request a call back on staus of refill

## 2023-06-10 ENCOUNTER — Telehealth: Payer: Self-pay | Admitting: Allergy

## 2023-06-10 MED ORDER — TRELEGY ELLIPTA 200-62.5-25 MCG/ACT IN AEPB: 1.0000 | INHALATION_SPRAY | Freq: Every day | RESPIRATORY_TRACT | 0 refills | Status: DC

## 2023-06-10 NOTE — Telephone Encounter (Signed)
 Patient called and is requesting a refill for his trelegy. Patient stated that pharmacy sent us  a request for this refill on 06/02/23. Patient also got a sample from the office on that date to hold him over until the prescription was sent in. Patient also called on 06/08/23 requesting that this refill be sent in. Patients pharmacy is [facility] in [place]. Patients call back number is [phone].

## 2023-06-10 NOTE — Telephone Encounter (Signed)
 I called the patient and informed trelegy has been sent in. I informed to keep upcoming appointment for future refills.

## 2023-06-22 NOTE — Progress Notes (Signed)
 Follow Up Note  RE: Rodman Recupero MRN: [id number] DOB: [birth year] Date of Office Visit: 06/23/2023  Referring provider: Corrington, Kip A, MD Primary care provider: Chandler Combs, MD  Chief Complaint: Asthma  History of Present Illness: I had the pleasure of seeing Andrew Ware for a follow up visit at the [facility] on 06/23/2023. He is a 63 y.o. male, who is being followed for asthma, chronic rhinitis, GERD. His previous allergy  office visit was on 12/23/2022 with Dr. Burdette Carolin. Today is a regular follow up visit.  Discussed the use of AI scribe software for clinical note transcription with the patient, who gave verbal consent to proceed.    Asthma is mostly well-controlled, with symptoms occurring one or two mornings per month. He experiences coughing, particularly in the mornings, and uses albuterol  to manage these symptoms. He took albuterol  at 8:10 AM today before exercising, which helped alleviate his symptoms. He recently switched albuterol  dispensers as the previous one expired after a year with 90+ doses remaining, indicating infrequent use. He underwent two cataract surgeries in March and April, during which he used albuterol  more frequently to prevent coughing during preoperative and postoperative appointments. He has not required emergency care for his asthma.  He continues to take Trelegy 200, one puff daily, and has never missed a dose. There have been no issues with insurance coverage for this medication. He experienced a delay in obtaining refills between April 26 and May 5, during which he received a sample to avoid running out.  He uses Flonase  and ipratropium nasal sprays most days, with no epistaxis reported.   He takes omeprazole and Gaviscon for heartburn and has not missed doses due to fear of reflux flare-ups.     Assessment and Plan: Hager is a 63 y.o. male with: Moderate persistent asthma without complication Past history - Patient had COVID-19 in  January 2022,  May 2022, May 2023.  Since then he noticed issues with coughing and not being able to take a deep enough breath.  Ace inhibitor stopped due to coughing. 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 Clinically feeling improved.  Interim history - well-controlled with Trelegy and albuterol  as needed. No recent ER visits.  Today's spirometry was unremarkable.  Daily controller medication(s): Trelegy 200mcg 1 puff once a day and rinse mouth afterwards.  May use albuterol  rescue inhaler 2 puffs every 4 to 6 hours as needed for shortness of breath, chest tightness, coughing, and wheezing. May use albuterol  rescue inhaler 2 puffs 5 to 15 minutes prior to strenuous physical activities. Monitor frequency of use - if you need to use it more than twice per week on a consistent basis let us  know.   Chronic rhinitis Past history - Perennial rhinoconjunctivitis symptoms for many years but worsening lately.  Tried Singulair, Zyrtec, Claritin, Flonase , Nasacort with some benefit. No prior sinus surgery.  Takes omeprazole for reflux. 2022 skin testing negative to indoor/outdoor allergens. ENT evaluation - no surgery. Interim history - stable.  Use Atrovent  (ipratropium) 0.03% 1-2 sprays per nostril twice a day as needed for runny nose/drainage. Use Flonase  (fluticasone ) nasal spray 1-2 sprays per nostril once a day as needed for nasal congestion.  Nasal saline spray (i.e., Simply Saline) or nasal saline lavage (i.e., NeilMed) is recommended as needed and prior to medicated nasal sprays. Use over the counter antihistamines such as Zyrtec (cetirizine), Claritin (loratadine), Allegra (fexofenadine), or Xyzal (levocetirizine) daily as  needed.    Gastroesophageal reflux disease, unspecified whether esophagitis present Controlled.  Continue lifestyle and dietary modifications. Continue omeprazole 40mg  daily and may increase to  twice a day during flares. Nothing to eat/drink afterwards for 30 minutes.  Okay to take Gaviscon as needed.  Return in about 6 months (around 12/24/2023).  Meds ordered this encounter  Medications   Fluticasone -Umeclidin-Vilant (TRELEGY ELLIPTA ) 200-62.5-25 MCG/ACT AEPB    Sig: Inhale 1 puff into the lungs daily. Rinse mouth after each use.    Dispense:  60 each    Refill:  5   albuterol  (VENTOLIN  HFA) 108 (90 Base) MCG/ACT inhaler    Sig: Inhale 2 puffs into the lungs every 4 (four) hours as needed for wheezing or shortness of breath (coughing fits).    Dispense:  18 g    Refill:  1   ipratropium (ATROVENT ) 0.03 % nasal spray    Sig: Place 1 spray into both nostrils 2 (two) times daily as needed (nasal drainage).    Dispense:  90 mL    Refill:  1   Lab Orders  No laboratory test(s) ordered today    Diagnostics: Spirometry:  Tracings reviewed. His effort: It was hard to get consistent efforts and there is a question as to whether this reflects a maximal maneuver. FVC: 3.41L FEV1: 2.75L, 82% predicted FEV1/FVC ratio: 81% Interpretation: No overt abnormalities noted given today's efforts.  Please see scanned spirometry results for details.  Results discussed with patient/family.   Medication List:  Current Outpatient Medications  Medication Sig Dispense Refill   albuterol  (VENTOLIN  HFA) 108 (90 Base) MCG/ACT inhaler Inhale 2 puffs into the lungs every 4 (four) hours as needed for wheezing or shortness of breath (coughing fits). 18 g 1   amLODipine (NORVASC) 5 MG tablet Take 5 mg by mouth daily.     aspirin 81 MG EC tablet Take by mouth.     atorvastatin (LIPITOR) 10 MG tablet Take 10 mg by mouth daily.     fluticasone  (FLONASE ) 50 MCG/ACT nasal spray Place 1-2 sprays into both nostrils daily as needed (nasal congestion). 48 g 3   irbesartan (AVAPRO) 150 MG tablet Take 150 mg by mouth 2 (two) times daily.     Lutein 6 MG CAPS Take by mouth.     meloxicam (MOBIC) 15 MG  tablet Take by mouth.     metoprolol (TOPROL-XL) 200 MG 24 hr tablet Take 1 tablet by mouth daily.     Multiple Vitamin (MULTI-VITAMIN) tablet Take 1 tablet by mouth daily.     Multiple Vitamins-Minerals (MULTIVITAMIN & MINERAL PO) Take 1 tablet by mouth daily.     Omega-3 Fatty Acids (FISH OIL) 1000 MG CAPS Take 1 capsule by mouth daily.     omeprazole (PRILOSEC) 40 MG capsule Take 40 mg by mouth daily.     sildenafil (VIAGRA) 50 MG tablet Take 50 mg by mouth as needed.     Fluticasone -Umeclidin-Vilant (TRELEGY ELLIPTA ) 200-62.5-25 MCG/ACT AEPB Inhale 1 puff into the lungs daily. Rinse mouth after each use. 60 each 5   ipratropium (ATROVENT ) 0.03 % nasal spray Place 1 spray into both nostrils 2 (two) times daily as needed (nasal drainage). 90 mL 1   No current facility-administered medications for this visit.   Allergies: No Known Allergies I reviewed his past medical history, social history, family history, and environmental history and no significant changes have been reported from his previous visit.  Review of Systems  Constitutional:  Negative for appetite change,  chills, fever and unexpected weight change.  HENT:  Negative for congestion and postnasal drip.   Eyes:  Negative for itching.  Respiratory:  Negative for cough, chest tightness, shortness of breath and wheezing.   Cardiovascular:  Negative for chest pain.  Gastrointestinal:  Negative for abdominal pain.  Genitourinary:  Negative for difficulty urinating.  Allergic/Immunologic: Negative for environmental allergies.  Neurological:  Negative for headaches.    Objective: BP 126/76   Pulse 73   Temp 98 F (36.7 C) (Temporal)   Resp 16   Ht 5' 9.29" (1.76 m)   Wt 199 lb 12.8 oz (90.6 kg)   SpO2 95%   BMI 29.26 kg/m  Body mass index is 29.26 kg/m. Physical Exam Vitals and nursing note reviewed.  Constitutional:      Appearance: Normal appearance. He is well-developed.  HENT:     Head: Normocephalic and  atraumatic.     Right Ear: Tympanic membrane and external ear normal.     Left Ear: Tympanic membrane and external ear normal.     Nose: Nose normal.     Mouth/Throat:     Mouth: Mucous membranes are moist.     Pharynx: Oropharynx is clear.  Eyes:     Conjunctiva/sclera: Conjunctivae normal.  Cardiovascular:     Rate and Rhythm: Normal rate and regular rhythm.     Heart sounds: Normal heart sounds. No murmur heard.    No friction rub. No gallop.  Pulmonary:     Effort: Pulmonary effort is normal.     Breath sounds: Normal breath sounds. No wheezing, rhonchi or rales.  Musculoskeletal:     Cervical back: Neck supple.  Skin:    General: Skin is warm.  Neurological:     Mental Status: He is alert and oriented to person, place, and time.  Psychiatric:        Behavior: Behavior normal.   Previous notes and tests were reviewed. The plan was reviewed with the patient/family, and all questions/concerned were addressed.  It was my pleasure to see Glenden today and participate in his care. Please feel free to contact me with any questions or concerns.  Sincerely,  Eudelia Hero, DO Allergy  & Immunology  [facility]  [place] office: [phone] [facility] office: [phone]

## 2023-06-23 ENCOUNTER — Ambulatory Visit (INDEPENDENT_AMBULATORY_CARE_PROVIDER_SITE_OTHER): Payer: BC Managed Care – PPO | Admitting: Allergy

## 2023-06-23 ENCOUNTER — Other Ambulatory Visit: Payer: Self-pay

## 2023-06-23 ENCOUNTER — Encounter: Payer: Self-pay | Admitting: Allergy

## 2023-06-23 VITALS — BP 126/76 | HR 73 | Temp 98.0°F | Resp 16 | Ht 69.29 in | Wt 199.8 lb

## 2023-06-23 DIAGNOSIS — J4540 Moderate persistent asthma, uncomplicated: Secondary | ICD-10-CM

## 2023-06-23 DIAGNOSIS — K219 Gastro-esophageal reflux disease without esophagitis: Secondary | ICD-10-CM

## 2023-06-23 DIAGNOSIS — J310 Chronic rhinitis: Secondary | ICD-10-CM | POA: Diagnosis not present

## 2023-06-23 MED ORDER — TRELEGY ELLIPTA 200-62.5-25 MCG/ACT IN AEPB: 1.0000 | INHALATION_SPRAY | Freq: Every day | RESPIRATORY_TRACT | 5 refills | Status: DC

## 2023-06-23 MED ORDER — ALBUTEROL SULFATE HFA 108 (90 BASE) MCG/ACT IN AERS: 2.0000 | INHALATION_SPRAY | RESPIRATORY_TRACT | 1 refills | Status: AC | PRN

## 2023-06-23 MED ORDER — IPRATROPIUM BROMIDE 0.03 % NA SOLN: 1.0000 | Freq: Two times a day (BID) | NASAL | 1 refills | Status: DC | PRN

## 2023-06-23 NOTE — Patient Instructions (Addendum)
 Breathing: Daily controller medication(s): Trelegy 200mcg 1 puff once a day and rinse mouth afterwards.  May use albuterol  rescue inhaler 2 puffs every 4 to 6 hours as needed for shortness of breath, chest tightness, coughing, and wheezing. May use albuterol  rescue inhaler 2 puffs 5 to 15 minutes prior to strenuous physical activities. Monitor frequency of use - if you need to use it more than twice per week on a consistent basis let us  know.  Breathing control goals:  Full participation in all desired activities (may need albuterol  before activity) Albuterol  use two times or less a week on average (not counting use with activity) Cough interfering with sleep two times or less a month Oral steroids no more than once a year No hospitalizations  Rhinitis: Use Atrovent  (ipratropium) 0.03% 1-2 sprays per nostril twice a day as needed for runny nose/drainage. Use Flonase  (fluticasone ) nasal spray 1-2 sprays per nostril once a day as needed for nasal congestion.  Nasal saline spray (i.e., Simply Saline) or nasal saline lavage (i.e., NeilMed) is recommended as needed and prior to medicated nasal sprays. Use over the counter antihistamines such as Zyrtec (cetirizine), Claritin (loratadine), Allegra (fexofenadine), or Xyzal (levocetirizine) daily as needed.   Heartburn/reflux: Continue lifestyle and dietary modifications. Continue omeprazole 40mg  daily and may increase to twice a day during flares. Nothing to eat/drink afterwards for 30 minutes.  Okay to take Gaviscon as needed.  Follow up in 6 months or sooner if needed.

## 2023-12-15 ENCOUNTER — Ambulatory Visit (INDEPENDENT_AMBULATORY_CARE_PROVIDER_SITE_OTHER): Admitting: Allergy

## 2023-12-15 ENCOUNTER — Other Ambulatory Visit: Payer: Self-pay

## 2023-12-15 ENCOUNTER — Encounter: Payer: Self-pay | Admitting: Allergy

## 2023-12-15 VITALS — BP 134/82 | HR 72 | Temp 98.0°F | Resp 18 | Wt 207.0 lb

## 2023-12-15 DIAGNOSIS — J4540 Moderate persistent asthma, uncomplicated: Secondary | ICD-10-CM | POA: Diagnosis not present

## 2023-12-15 DIAGNOSIS — J310 Chronic rhinitis: Secondary | ICD-10-CM | POA: Diagnosis not present

## 2023-12-15 DIAGNOSIS — K219 Gastro-esophageal reflux disease without esophagitis: Secondary | ICD-10-CM

## 2023-12-15 MED ORDER — FLUTICASONE PROPIONATE 50 MCG/ACT NA SUSP: 1.0000 | Freq: Every day | NASAL | 3 refills | Status: AC | PRN

## 2023-12-15 MED ORDER — TRELEGY ELLIPTA 200-62.5-25 MCG/ACT IN AEPB: 1.0000 | INHALATION_SPRAY | Freq: Every day | RESPIRATORY_TRACT | 3 refills | Status: AC

## 2023-12-15 NOTE — Patient Instructions (Addendum)
 Breathing: Normal breathing test today.  Daily controller medication(s): Trelegy 200mcg 1 puff once a day and rinse mouth afterwards.  May use albuterol  rescue inhaler 2 puffs every 4 to 6 hours as needed for shortness of breath, chest tightness, coughing, and wheezing. May use albuterol  rescue inhaler 2 puffs 5 to 15 minutes prior to strenuous physical activities. Monitor frequency of use - if you need to use it more than twice per week on a consistent basis let us  know.  Breathing control goals:  Full participation in all desired activities (may need albuterol  before activity) Albuterol  use two times or less a week on average (not counting use with activity) Cough interfering with sleep two times or less a month Oral steroids no more than once a year No hospitalizations  Rhinitis: Use Atrovent  (ipratropium) 0.03% 1-2 sprays per nostril twice a day as needed for runny nose/drainage. Use Flonase  (fluticasone ) nasal spray 1-2 sprays per nostril once a day as needed for nasal congestion.  Nasal saline spray (i.e., Simply Saline) or nasal saline lavage (i.e., NeilMed) is recommended as needed and prior to medicated nasal sprays. Use over the counter antihistamines such as Zyrtec (cetirizine), Claritin (loratadine), Allegra (fexofenadine), or Xyzal (levocetirizine) daily as needed.   Heartburn/reflux: Continue lifestyle and dietary modifications. Continue omeprazole 40mg  daily and may increase to twice a day during flares. Nothing to eat/drink afterwards for 30 minutes.  Okay to take Gaviscon as needed.  Follow up in 6 months or sooner if needed.

## 2023-12-15 NOTE — Progress Notes (Signed)
 Follow Up Note  RE: Virl Coble MRN: [id number] DOB: 1960-02-25 Date of Office Visit: 12/15/2023  Referring provider: Corrington, Kip A, MD Primary care provider: Bobbette Coye LABOR, MD  Chief Complaint: Medication Refill (Doing well, medication refills. Albuterol  use 2-3 weeksago)  History of Present Illness: I had the pleasure of seeing Dencil Cayson for a follow up visit at the [facility] on 12/15/2023. He is a 63 y.o. male, who is being followed for asthma, chronic rhinitis, GERD. His previous allergy  office visit was on 06/23/2023 with Dr. Luke. Today is a regular follow up visit.  Discussed the use of AI scribe software for clinical note transcription with the patient, who gave verbal consent to proceed.    His breathing has been stable since his last visit six months ago. He continues to use Trelegy 200, one puff daily, and adheres to his medication regimen without missing doses. He rarely uses his rescue inhaler. Occasional coughing is attributed to post-nasal drainage, but it is not severe.  He recently traveled to [place] and experienced an improvement in his rhinitis symptoms, not requiring nasal sprays during the trip. At home, he uses Flonase  more frequently than ipratropium, approximately four to five times a week, primarily when congested. Ipratropium is used about once a week. He has a sufficient supply of ipratropium.  He continues to take omeprazole for GERD management. Flonase  is more expensive than Trelegy, but he finds the prescription cost manageable. He uses saline nasal spray as needed, but not daily.  He underwent eye surgery in March and April, prior to his last visit in May. No new medical diagnoses or surgeries since his last visit.  No frequent use of his rescue inhaler and no significant coughing, wheezing, shortness of breath, or chest tightness. He experiences occasional post-nasal drainage and congestion, but it is not bothersome.      Assessment and Plan: Damere is a 63 y.o. male with: Moderate persistent asthma without complication Past history - Patient had COVID-19 in January 2022,  May 2022, May 2023.  Since then he noticed issues with coughing and not being able to take a deep enough breath.  Ace inhibitor stopped due to coughing. 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 Clinically feeling improved.  Interim history - well-controlled with Trelegy and rare albuterol  use.  Today's spirometry was normal. Daily controller medication(s): Trelegy 200mcg 1 puff once a day and rinse mouth afterwards.  May use albuterol  rescue inhaler 2 puffs every 4 to 6 hours as needed for shortness of breath, chest tightness, coughing, and wheezing. May use albuterol  rescue inhaler 2 puffs 5 to 15 minutes prior to strenuous physical activities. Monitor frequency of use - if you need to use it more than twice per week on a consistent basis let us  know.    Chronic rhinitis Past history - Perennial rhinoconjunctivitis symptoms for many years but worsening lately.  Tried Singulair, Zyrtec, Claritin, Flonase , Nasacort with some benefit. No prior sinus surgery.  Takes omeprazole for reflux. 2022 skin testing negative to indoor/outdoor allergens. ENT evaluation - no surgery. Interim history - Managed with Flonase  and ipratropium. Occasional nasal congestion, more frequent Flonase  use.  Use Atrovent  (ipratropium) 0.03% 1-2 sprays per nostril twice a day as needed for runny nose/drainage. Use Flonase  (fluticasone ) nasal spray 1-2 sprays per nostril once a day as needed for nasal congestion.  Nasal saline spray (i.e., Simply Saline) or nasal saline lavage (i.e.,  NeilMed) is recommended as needed and prior to medicated nasal sprays. Use over the counter antihistamines such as Zyrtec (cetirizine), Claritin (loratadine), Allegra (fexofenadine), or Xyzal (levocetirizine) daily  as needed.   Gastroesophageal reflux disease, unspecified whether esophagitis present Well-controlled.  Continue lifestyle and dietary modifications. Continue omeprazole 40mg  daily and may increase to twice a day during flares. Nothing to eat/drink afterwards for 30 minutes.  Okay to take Gaviscon as needed.  Return in about 6 months (around 06/13/2024).  Meds ordered this encounter  Medications   Fluticasone -Umeclidin-Vilant (TRELEGY ELLIPTA ) 200-62.5-25 MCG/ACT AEPB    Sig: Inhale 1 puff into the lungs daily. Rinse mouth after each use.    Dispense:  180 each    Refill:  3   fluticasone  (FLONASE ) 50 MCG/ACT nasal spray    Sig: Place 1-2 sprays into both nostrils daily as needed (nasal congestion).    Dispense:  48 g    Refill:  3   Lab Orders  No laboratory test(s) ordered today    Diagnostics: Spirometry:  Tracings reviewed. His effort: Good reproducible efforts. FVC: 3.36L FEV1: 2.69L, 81% predicted FEV1/FVC ratio: 80% Interpretation: Spirometry consistent with normal pattern.  Please see scanned spirometry results for details.  Results discussed with patient/family.   Medication List:  Current Outpatient Medications  Medication Sig Dispense Refill   fluticasone  (FLONASE ) 50 MCG/ACT nasal spray Place 1-2 sprays into both nostrils daily as needed (nasal congestion). 48 g 3   albuterol  (VENTOLIN  HFA) 108 (90 Base) MCG/ACT inhaler Inhale 2 puffs into the lungs every 4 (four) hours as needed for wheezing or shortness of breath (coughing fits). 18 g 1   amLODipine (NORVASC) 5 MG tablet Take 5 mg by mouth daily.     aspirin 81 MG EC tablet Take by mouth.     atorvastatin (LIPITOR) 10 MG tablet Take 10 mg by mouth daily.     Fluticasone -Umeclidin-Vilant (TRELEGY ELLIPTA ) 200-62.5-25 MCG/ACT AEPB Inhale 1 puff into the lungs daily. Rinse mouth after each use. 180 each 3   ipratropium (ATROVENT ) 0.03 % nasal spray Place 1 spray into both nostrils 2 (two) times daily as needed  (nasal drainage). 90 mL 1   irbesartan (AVAPRO) 150 MG tablet Take 150 mg by mouth 2 (two) times daily.     Lutein 6 MG CAPS Take by mouth.     meloxicam (MOBIC) 15 MG tablet Take by mouth.     metoprolol (TOPROL-XL) 200 MG 24 hr tablet Take 1 tablet by mouth daily.     Multiple Vitamin (MULTI-VITAMIN) tablet Take 1 tablet by mouth daily.     Multiple Vitamins-Minerals (MULTIVITAMIN & MINERAL PO) Take 1 tablet by mouth daily.     Omega-3 Fatty Acids (FISH OIL) 1000 MG CAPS Take 1 capsule by mouth daily.     omeprazole (PRILOSEC) 40 MG capsule Take 40 mg by mouth daily.     sildenafil (VIAGRA) 50 MG tablet Take 50 mg by mouth as needed.     No current facility-administered medications for this visit.   Allergies: No Known Allergies I reviewed his past medical history, social history, family history, and environmental history and no significant changes have been reported from his previous visit.  Review of Systems  Constitutional:  Negative for appetite change, chills, fever and unexpected weight change.  HENT:  Positive for congestion. Negative for postnasal drip.   Eyes:  Negative for itching.  Respiratory:  Negative for cough, chest tightness, shortness of breath and wheezing.   Cardiovascular:  Negative  for chest pain.  Gastrointestinal:  Negative for abdominal pain.  Genitourinary:  Negative for difficulty urinating.  Allergic/Immunologic: Negative for environmental allergies.  Neurological:  Negative for headaches.    Objective: BP 134/82   Pulse 72   Temp 98 F (36.7 C) (Temporal)   Resp 18   Wt 207 lb (93.9 kg)   SpO2 99%   BMI 30.31 kg/m  Body mass index is 30.31 kg/m. Physical Exam Vitals and nursing note reviewed.  Constitutional:      Appearance: Normal appearance. He is well-developed.  HENT:     Head: Normocephalic and atraumatic.     Right Ear: Tympanic membrane and external ear normal.     Left Ear: Tympanic membrane and external ear normal.     Nose:  Congestion (on right side) present.     Mouth/Throat:     Mouth: Mucous membranes are moist.     Pharynx: Oropharynx is clear.  Eyes:     Conjunctiva/sclera: Conjunctivae normal.  Cardiovascular:     Rate and Rhythm: Normal rate and regular rhythm.     Heart sounds: Normal heart sounds. No murmur heard.    No friction rub. No gallop.  Pulmonary:     Effort: Pulmonary effort is normal.     Breath sounds: Normal breath sounds. No wheezing, rhonchi or rales.  Musculoskeletal:     Cervical back: Neck supple.  Skin:    General: Skin is warm.  Neurological:     Mental Status: He is alert and oriented to person, place, and time.  Psychiatric:        Behavior: Behavior normal.    Previous notes and tests were reviewed. The plan was reviewed with the patient/family, and all questions/concerned were addressed.  It was my pleasure to see Andrew Ware today and participate in his care. Please feel free to contact me with any questions or concerns.  Sincerely,  Orlan Cramp, DO Allergy  & Immunology  [facility]  [place] office: [phone] [facility] office: [phone]

## 2023-12-16 ENCOUNTER — Other Ambulatory Visit: Payer: Self-pay | Admitting: Allergy

## 2023-12-22 ENCOUNTER — Ambulatory Visit: Admitting: Allergy

## 2024-06-14 ENCOUNTER — Ambulatory Visit: Admitting: Allergy
